# Patient Record
Sex: Male | Born: 1950 | Race: White | Hispanic: No | Marital: Married | Smoking: Never smoker
Health system: Southern US, Community
[De-identification: ages and names within clinical notes are randomized; demographics above are authoritative.]

## PROBLEM LIST (undated history)

## (undated) DIAGNOSIS — E039 Hypothyroidism, unspecified: Secondary | ICD-10-CM

## (undated) DIAGNOSIS — F329 Major depressive disorder, single episode, unspecified: Secondary | ICD-10-CM

## (undated) DIAGNOSIS — G4733 Obstructive sleep apnea (adult) (pediatric): Secondary | ICD-10-CM

## (undated) DIAGNOSIS — I619 Nontraumatic intracerebral hemorrhage, unspecified: Secondary | ICD-10-CM

## (undated) DIAGNOSIS — F419 Anxiety disorder, unspecified: Secondary | ICD-10-CM

## (undated) DIAGNOSIS — R519 Headache, unspecified: Secondary | ICD-10-CM

## (undated) DIAGNOSIS — R413 Other amnesia: Secondary | ICD-10-CM

## (undated) DIAGNOSIS — G629 Polyneuropathy, unspecified: Secondary | ICD-10-CM

## (undated) DIAGNOSIS — S069X9A Unspecified intracranial injury with loss of consciousness of unspecified duration, initial encounter: Secondary | ICD-10-CM

## (undated) DIAGNOSIS — H524 Presbyopia: Secondary | ICD-10-CM

## (undated) DIAGNOSIS — E785 Hyperlipidemia, unspecified: Secondary | ICD-10-CM

## (undated) DIAGNOSIS — R51 Headache: Secondary | ICD-10-CM

## (undated) DIAGNOSIS — E669 Obesity, unspecified: Secondary | ICD-10-CM

## (undated) DIAGNOSIS — B349 Viral infection, unspecified: Secondary | ICD-10-CM

## (undated) DIAGNOSIS — R531 Weakness: Secondary | ICD-10-CM

## (undated) DIAGNOSIS — I480 Paroxysmal atrial fibrillation: Secondary | ICD-10-CM

## (undated) DIAGNOSIS — F32A Depression, unspecified: Secondary | ICD-10-CM

## (undated) HISTORY — PX: KNEE SURGERY: SHX244

## (undated) HISTORY — PX: ROTATOR CUFF REPAIR: SHX139

## (undated) HISTORY — PX: APPENDECTOMY: SHX54

## (undated) HISTORY — PX: ACHILLES TENDON REPAIR: SUR1153

## (undated) HISTORY — DX: Weakness: R53.1

## (undated) HISTORY — DX: Presbyopia: H52.4

---

## 1999-01-09 ENCOUNTER — Emergency Department (HOSPITAL_COMMUNITY): Admission: EM | Admit: 1999-01-09 | Discharge: 1999-01-09 | Payer: Self-pay

## 2005-12-06 ENCOUNTER — Ambulatory Visit: Payer: Self-pay | Admitting: Gastroenterology

## 2005-12-20 ENCOUNTER — Ambulatory Visit: Payer: Self-pay | Admitting: Gastroenterology

## 2005-12-20 ENCOUNTER — Encounter (INDEPENDENT_AMBULATORY_CARE_PROVIDER_SITE_OTHER): Payer: Self-pay | Admitting: Specialist

## 2006-02-26 ENCOUNTER — Encounter: Admission: RE | Admit: 2006-02-26 | Discharge: 2006-02-26 | Payer: Self-pay | Admitting: Family Medicine

## 2006-05-02 ENCOUNTER — Encounter: Admission: RE | Admit: 2006-05-02 | Discharge: 2006-05-02 | Payer: Self-pay | Admitting: Orthopedic Surgery

## 2006-12-12 ENCOUNTER — Ambulatory Visit: Payer: Self-pay | Admitting: Family Medicine

## 2007-12-18 ENCOUNTER — Ambulatory Visit: Payer: Self-pay | Admitting: Family Medicine

## 2008-01-06 ENCOUNTER — Ambulatory Visit: Payer: Self-pay | Admitting: Family Medicine

## 2008-11-05 DIAGNOSIS — S069XAA Unspecified intracranial injury with loss of consciousness status unknown, initial encounter: Secondary | ICD-10-CM

## 2008-11-05 DIAGNOSIS — S069X9A Unspecified intracranial injury with loss of consciousness of unspecified duration, initial encounter: Secondary | ICD-10-CM

## 2008-11-05 HISTORY — DX: Unspecified intracranial injury with loss of consciousness status unknown, initial encounter: S06.9XAA

## 2008-11-05 HISTORY — DX: Unspecified intracranial injury with loss of consciousness of unspecified duration, initial encounter: S06.9X9A

## 2009-01-05 ENCOUNTER — Ambulatory Visit: Payer: Self-pay | Admitting: Family Medicine

## 2009-05-02 ENCOUNTER — Ambulatory Visit: Payer: Self-pay | Admitting: Family Medicine

## 2009-06-20 ENCOUNTER — Ambulatory Visit: Payer: Self-pay | Admitting: Family Medicine

## 2009-06-20 ENCOUNTER — Encounter: Admission: RE | Admit: 2009-06-20 | Discharge: 2009-06-20 | Payer: Self-pay | Admitting: Family Medicine

## 2009-07-01 ENCOUNTER — Encounter: Admission: RE | Admit: 2009-07-01 | Discharge: 2009-07-01 | Payer: Self-pay | Admitting: Neurological Surgery

## 2009-08-01 ENCOUNTER — Encounter: Admission: RE | Admit: 2009-08-01 | Discharge: 2009-08-01 | Payer: Self-pay | Admitting: Neurological Surgery

## 2010-11-27 ENCOUNTER — Encounter: Payer: Self-pay | Admitting: Family Medicine

## 2013-01-06 ENCOUNTER — Ambulatory Visit: Payer: Non-veteran care | Attending: Physician Assistant | Admitting: Physical Therapy

## 2013-01-06 DIAGNOSIS — IMO0001 Reserved for inherently not codable concepts without codable children: Secondary | ICD-10-CM | POA: Insufficient documentation

## 2013-01-06 DIAGNOSIS — M25669 Stiffness of unspecified knee, not elsewhere classified: Secondary | ICD-10-CM | POA: Insufficient documentation

## 2013-01-06 DIAGNOSIS — R262 Difficulty in walking, not elsewhere classified: Secondary | ICD-10-CM | POA: Insufficient documentation

## 2013-01-06 DIAGNOSIS — M25569 Pain in unspecified knee: Secondary | ICD-10-CM | POA: Insufficient documentation

## 2013-01-12 ENCOUNTER — Ambulatory Visit: Payer: Non-veteran care | Admitting: Physical Therapy

## 2013-01-14 ENCOUNTER — Ambulatory Visit: Payer: Non-veteran care | Admitting: Physical Therapy

## 2013-01-16 ENCOUNTER — Ambulatory Visit: Payer: Non-veteran care | Admitting: Physical Therapy

## 2013-01-26 ENCOUNTER — Ambulatory Visit: Payer: Non-veteran care | Admitting: Physical Therapy

## 2013-01-28 ENCOUNTER — Ambulatory Visit: Payer: Non-veteran care | Admitting: Physical Therapy

## 2013-01-30 ENCOUNTER — Ambulatory Visit: Payer: Non-veteran care | Admitting: Physical Therapy

## 2013-02-02 ENCOUNTER — Ambulatory Visit: Payer: Non-veteran care | Admitting: Physical Therapy

## 2013-02-04 ENCOUNTER — Ambulatory Visit: Payer: Non-veteran care | Attending: Physician Assistant | Admitting: Physical Therapy

## 2013-02-04 DIAGNOSIS — R262 Difficulty in walking, not elsewhere classified: Secondary | ICD-10-CM | POA: Insufficient documentation

## 2013-02-04 DIAGNOSIS — M25569 Pain in unspecified knee: Secondary | ICD-10-CM | POA: Insufficient documentation

## 2013-02-04 DIAGNOSIS — M25669 Stiffness of unspecified knee, not elsewhere classified: Secondary | ICD-10-CM | POA: Insufficient documentation

## 2013-02-04 DIAGNOSIS — IMO0001 Reserved for inherently not codable concepts without codable children: Secondary | ICD-10-CM | POA: Insufficient documentation

## 2013-02-06 ENCOUNTER — Ambulatory Visit: Payer: Non-veteran care | Admitting: Physical Therapy

## 2013-02-09 ENCOUNTER — Ambulatory Visit: Payer: Non-veteran care | Admitting: Physical Therapy

## 2013-02-11 ENCOUNTER — Ambulatory Visit: Payer: Non-veteran care | Admitting: Physical Therapy

## 2013-02-13 ENCOUNTER — Ambulatory Visit: Payer: Non-veteran care | Admitting: Rehabilitation

## 2013-02-16 ENCOUNTER — Ambulatory Visit: Payer: Non-veteran care | Admitting: Physical Therapy

## 2013-02-18 ENCOUNTER — Ambulatory Visit: Payer: Non-veteran care | Admitting: Physical Therapy

## 2013-02-24 ENCOUNTER — Ambulatory Visit: Payer: Non-veteran care | Admitting: Rehabilitation

## 2013-02-24 ENCOUNTER — Ambulatory Visit: Payer: Non-veteran care | Admitting: Physical Therapy

## 2013-02-26 ENCOUNTER — Ambulatory Visit: Payer: Non-veteran care | Admitting: Physical Therapy

## 2015-12-07 ENCOUNTER — Encounter: Payer: Self-pay | Admitting: Gastroenterology

## 2016-10-08 DIAGNOSIS — E039 Hypothyroidism, unspecified: Secondary | ICD-10-CM | POA: Diagnosis present

## 2016-10-08 DIAGNOSIS — E079 Disorder of thyroid, unspecified: Secondary | ICD-10-CM | POA: Diagnosis present

## 2016-10-08 DIAGNOSIS — F32A Depression, unspecified: Secondary | ICD-10-CM | POA: Diagnosis present

## 2016-10-08 DIAGNOSIS — F419 Anxiety disorder, unspecified: Secondary | ICD-10-CM | POA: Diagnosis present

## 2016-10-08 DIAGNOSIS — F329 Major depressive disorder, single episode, unspecified: Secondary | ICD-10-CM | POA: Diagnosis present

## 2016-10-15 ENCOUNTER — Inpatient Hospital Stay (HOSPITAL_COMMUNITY)
Admission: EM | Admit: 2016-10-15 | Discharge: 2016-10-21 | DRG: 193 | Disposition: A | Discharge status: Home / Self Care | Payer: BC Managed Care – PPO | Attending: Internal Medicine | Admitting: Internal Medicine

## 2016-10-15 ENCOUNTER — Emergency Department (HOSPITAL_COMMUNITY): Payer: BC Managed Care – PPO

## 2016-10-15 ENCOUNTER — Encounter (HOSPITAL_COMMUNITY): Payer: Self-pay | Admitting: Emergency Medicine

## 2016-10-15 ENCOUNTER — Inpatient Hospital Stay (HOSPITAL_COMMUNITY): Payer: BC Managed Care – PPO

## 2016-10-15 DIAGNOSIS — Z841 Family history of disorders of kidney and ureter: Secondary | ICD-10-CM | POA: Diagnosis not present

## 2016-10-15 DIAGNOSIS — F329 Major depressive disorder, single episode, unspecified: Secondary | ICD-10-CM | POA: Diagnosis present

## 2016-10-15 DIAGNOSIS — F323 Major depressive disorder, single episode, severe with psychotic features: Secondary | ICD-10-CM | POA: Diagnosis not present

## 2016-10-15 DIAGNOSIS — I514 Myocarditis, unspecified: Secondary | ICD-10-CM | POA: Diagnosis not present

## 2016-10-15 DIAGNOSIS — I480 Paroxysmal atrial fibrillation: Secondary | ICD-10-CM | POA: Diagnosis present

## 2016-10-15 DIAGNOSIS — G43909 Migraine, unspecified, not intractable, without status migrainosus: Secondary | ICD-10-CM

## 2016-10-15 DIAGNOSIS — I10 Essential (primary) hypertension: Secondary | ICD-10-CM | POA: Diagnosis present

## 2016-10-15 DIAGNOSIS — Z9119 Patient's noncompliance with other medical treatment and regimen: Secondary | ICD-10-CM

## 2016-10-15 DIAGNOSIS — R945 Abnormal results of liver function studies: Secondary | ICD-10-CM | POA: Diagnosis present

## 2016-10-15 DIAGNOSIS — R0602 Shortness of breath: Secondary | ICD-10-CM | POA: Diagnosis present

## 2016-10-15 DIAGNOSIS — R7989 Other specified abnormal findings of blood chemistry: Secondary | ICD-10-CM

## 2016-10-15 DIAGNOSIS — G4733 Obstructive sleep apnea (adult) (pediatric): Secondary | ICD-10-CM

## 2016-10-15 DIAGNOSIS — E785 Hyperlipidemia, unspecified: Secondary | ICD-10-CM | POA: Diagnosis present

## 2016-10-15 DIAGNOSIS — E8770 Fluid overload, unspecified: Secondary | ICD-10-CM | POA: Diagnosis present

## 2016-10-15 DIAGNOSIS — Z888 Allergy status to other drugs, medicaments and biological substances status: Secondary | ICD-10-CM | POA: Diagnosis not present

## 2016-10-15 DIAGNOSIS — Z8042 Family history of malignant neoplasm of prostate: Secondary | ICD-10-CM

## 2016-10-15 DIAGNOSIS — G43011 Migraine without aura, intractable, with status migrainosus: Secondary | ICD-10-CM

## 2016-10-15 DIAGNOSIS — F419 Anxiety disorder, unspecified: Secondary | ICD-10-CM | POA: Diagnosis present

## 2016-10-15 DIAGNOSIS — R7303 Prediabetes: Secondary | ICD-10-CM

## 2016-10-15 DIAGNOSIS — I959 Hypotension, unspecified: Secondary | ICD-10-CM | POA: Diagnosis present

## 2016-10-15 DIAGNOSIS — E669 Obesity, unspecified: Secondary | ICD-10-CM

## 2016-10-15 DIAGNOSIS — F32A Depression, unspecified: Secondary | ICD-10-CM | POA: Diagnosis present

## 2016-10-15 DIAGNOSIS — Z79899 Other long term (current) drug therapy: Secondary | ICD-10-CM

## 2016-10-15 DIAGNOSIS — R000 Tachycardia, unspecified: Secondary | ICD-10-CM | POA: Diagnosis present

## 2016-10-15 DIAGNOSIS — J9601 Acute respiratory failure with hypoxia: Secondary | ICD-10-CM

## 2016-10-15 DIAGNOSIS — Z8249 Family history of ischemic heart disease and other diseases of the circulatory system: Secondary | ICD-10-CM

## 2016-10-15 DIAGNOSIS — R748 Abnormal levels of other serum enzymes: Secondary | ICD-10-CM

## 2016-10-15 DIAGNOSIS — Z6841 Body Mass Index (BMI) 40.0 and over, adult: Secondary | ICD-10-CM

## 2016-10-15 DIAGNOSIS — E876 Hypokalemia: Secondary | ICD-10-CM | POA: Diagnosis present

## 2016-10-15 DIAGNOSIS — G629 Polyneuropathy, unspecified: Secondary | ICD-10-CM

## 2016-10-15 DIAGNOSIS — I409 Acute myocarditis, unspecified: Secondary | ICD-10-CM

## 2016-10-15 DIAGNOSIS — J181 Lobar pneumonia, unspecified organism: Secondary | ICD-10-CM | POA: Diagnosis not present

## 2016-10-15 DIAGNOSIS — E079 Disorder of thyroid, unspecified: Secondary | ICD-10-CM | POA: Diagnosis present

## 2016-10-15 DIAGNOSIS — I9589 Other hypotension: Secondary | ICD-10-CM | POA: Diagnosis not present

## 2016-10-15 DIAGNOSIS — E6609 Other obesity due to excess calories: Secondary | ICD-10-CM | POA: Diagnosis not present

## 2016-10-15 DIAGNOSIS — J189 Pneumonia, unspecified organism: Secondary | ICD-10-CM | POA: Diagnosis present

## 2016-10-15 DIAGNOSIS — E039 Hypothyroidism, unspecified: Secondary | ICD-10-CM | POA: Diagnosis present

## 2016-10-15 DIAGNOSIS — F321 Major depressive disorder, single episode, moderate: Secondary | ICD-10-CM | POA: Diagnosis not present

## 2016-10-15 HISTORY — DX: Viral infection, unspecified: B34.9

## 2016-10-15 HISTORY — DX: Other amnesia: R41.3

## 2016-10-15 HISTORY — DX: Polyneuropathy, unspecified: G62.9

## 2016-10-15 HISTORY — DX: Unspecified intracranial injury with loss of consciousness of unspecified duration, initial encounter: S06.9X9A

## 2016-10-15 HISTORY — DX: Paroxysmal atrial fibrillation: I48.0

## 2016-10-15 HISTORY — DX: Headache: R51

## 2016-10-15 HISTORY — DX: Hyperlipidemia, unspecified: E78.5

## 2016-10-15 HISTORY — DX: Headache, unspecified: R51.9

## 2016-10-15 HISTORY — DX: Hypothyroidism, unspecified: E03.9

## 2016-10-15 HISTORY — DX: Obstructive sleep apnea (adult) (pediatric): G47.33

## 2016-10-15 HISTORY — DX: Major depressive disorder, single episode, unspecified: F32.9

## 2016-10-15 HISTORY — DX: Nontraumatic intracerebral hemorrhage, unspecified: I61.9

## 2016-10-15 HISTORY — DX: Obesity, unspecified: E66.9

## 2016-10-15 HISTORY — DX: Depression, unspecified: F32.A

## 2016-10-15 HISTORY — DX: Anxiety disorder, unspecified: F41.9

## 2016-10-15 LAB — URINALYSIS, ROUTINE W REFLEX MICROSCOPIC
BILIRUBIN URINE: NEGATIVE
GLUCOSE, UA: NEGATIVE mg/dL
HGB URINE DIPSTICK: NEGATIVE
KETONES UR: NEGATIVE mg/dL
Leukocytes, UA: NEGATIVE
Nitrite: NEGATIVE
PROTEIN: NEGATIVE mg/dL
Specific Gravity, Urine: 1.011 (ref 1.005–1.030)
pH: 5 (ref 5.0–8.0)

## 2016-10-15 LAB — TROPONIN I
Troponin I: <0.03 ng/mL (ref <0.03)
Troponin I: <0.03 ng/mL (ref <0.03)
Troponin I: 0.09 ng/mL (ref <0.03)

## 2016-10-15 LAB — COMPREHENSIVE METABOLIC PANEL
ALK PHOS: 338 U/L — AB (ref 38–126)
ALT: 63 U/L (ref 17–63)
ANION GAP: 12 (ref 5–15)
AST: 56 U/L — ABNORMAL HIGH (ref 15–41)
Albumin: 2.5 g/dL — ABNORMAL LOW (ref 3.5–5.0)
BUN: 12 mg/dL (ref 6–20)
CALCIUM: 8.9 mg/dL (ref 8.9–10.3)
CHLORIDE: 101 mmol/L (ref 101–111)
CO2: 24 mmol/L (ref 22–32)
CREATININE: 1.08 mg/dL (ref 0.61–1.24)
Glucose, Bld: 101 mg/dL — ABNORMAL HIGH (ref 65–99)
Potassium: 2.7 mmol/L — CL (ref 3.5–5.1)
SODIUM: 137 mmol/L (ref 135–145)
Total Bilirubin: 1.9 mg/dL — ABNORMAL HIGH (ref 0.3–1.2)
Total Protein: 5.6 g/dL — ABNORMAL LOW (ref 6.5–8.1)

## 2016-10-15 LAB — CBC WITH DIFFERENTIAL/PLATELET
BASOS ABS: 0 10*3/uL (ref 0.0–0.1)
BASOS PCT: 0 %
EOS PCT: 2 %
Eosinophils Absolute: 0.2 10*3/uL (ref 0.0–0.7)
HEMATOCRIT: 36.7 % — AB (ref 39.0–52.0)
Hemoglobin: 12.3 g/dL — ABNORMAL LOW (ref 13.0–17.0)
Lymphocytes Relative: 2 %
Lymphs Abs: 0.2 10*3/uL — ABNORMAL LOW (ref 0.7–4.0)
MCH: 30.1 pg (ref 26.0–34.0)
MCHC: 33.5 g/dL (ref 30.0–36.0)
MCV: 89.7 fL (ref 78.0–100.0)
MONOS PCT: 2 %
Monocytes Absolute: 0.2 10*3/uL (ref 0.1–1.0)
NEUTROS ABS: 9.3 10*3/uL — AB (ref 1.7–7.7)
Neutrophils Relative %: 94 %
PLATELETS: 242 10*3/uL (ref 150–400)
RBC: 4.09 MIL/uL — ABNORMAL LOW (ref 4.22–5.81)
RDW: 13.7 % (ref 11.5–15.5)
WBC: 9.9 10*3/uL (ref 4.0–10.5)

## 2016-10-15 LAB — ECHOCARDIOGRAM COMPLETE

## 2016-10-15 LAB — MRSA PCR SCREENING: MRSA by PCR: NEGATIVE

## 2016-10-15 LAB — PROCALCITONIN: PROCALCITONIN: 9.19 ng/mL

## 2016-10-15 LAB — BILIRUBIN, FRACTIONATED(TOT/DIR/INDIR)
BILIRUBIN INDIRECT: 0.7 mg/dL (ref 0.3–0.9)
Bilirubin, Direct: 0.7 mg/dL — ABNORMAL HIGH (ref 0.1–0.5)
Total Bilirubin: 1.4 mg/dL — ABNORMAL HIGH (ref 0.3–1.2)

## 2016-10-15 LAB — D-DIMER, QUANTITATIVE (NOT AT ARMC): D DIMER QUANT: 8.94 ug{FEU}/mL — AB (ref 0.00–0.50)

## 2016-10-15 LAB — PREALBUMIN: PREALBUMIN: 6.1 mg/dL — AB (ref 18–38)

## 2016-10-15 LAB — TSH: TSH: 3.072 u[IU]/mL (ref 0.350–4.500)

## 2016-10-15 LAB — BRAIN NATRIURETIC PEPTIDE: B NATRIURETIC PEPTIDE 5: 478.8 pg/mL — AB (ref 0.0–100.0)

## 2016-10-15 LAB — I-STAT CG4 LACTIC ACID, ED: LACTIC ACID, VENOUS: 2.38 mmol/L — AB (ref 0.5–1.9)

## 2016-10-15 LAB — LACTIC ACID, PLASMA: LACTIC ACID, VENOUS: 2.3 mmol/L — AB (ref 0.5–1.9)

## 2016-10-15 MED ORDER — MAGNESIUM CITRATE PO SOLN: 1.0000 | Freq: Once | ORAL | Status: DC | PRN

## 2016-10-15 MED ORDER — VANCOMYCIN HCL IN DEXTROSE 1-5 GM/200ML-% IV SOLN
1000.0000 mg | Freq: Once | INTRAVENOUS | Status: AC
  Administered 2016-10-15: 1000 mg via INTRAVENOUS
  Filled 2016-10-15: qty 200

## 2016-10-15 MED ORDER — HYDROCODONE-ACETAMINOPHEN 5-325 MG PO TABS
1.0000 | ORAL_TABLET | ORAL | Status: DC | PRN
  Administered 2016-10-20: 1 via ORAL
  Filled 2016-10-15: qty 1

## 2016-10-15 MED ORDER — ONDANSETRON HCL 4 MG/2ML IJ SOLN
4.0000 mg | Freq: Four times a day (QID) | INTRAMUSCULAR | Status: DC | PRN
Start: 2016-10-15 — End: 2016-10-21

## 2016-10-15 MED ORDER — SODIUM CHLORIDE 0.9% FLUSH
3.0000 mL | Freq: Two times a day (BID) | INTRAVENOUS | Status: DC
  Administered 2016-10-15 – 2016-10-21 (×12): 3 mL via INTRAVENOUS

## 2016-10-15 MED ORDER — DICLOFENAC SODIUM 75 MG PO TBEC: 75.0000 mg | DELAYED_RELEASE_TABLET | Freq: Every day | ORAL | Status: DC | PRN

## 2016-10-15 MED ORDER — ENOXAPARIN SODIUM 40 MG/0.4ML ~~LOC~~ SOLN
40.0000 mg | SUBCUTANEOUS | Status: DC
  Administered 2016-10-15 – 2016-10-21 (×7): 40 mg via SUBCUTANEOUS
  Filled 2016-10-15 (×7): qty 0.4

## 2016-10-15 MED ORDER — SIMVASTATIN 40 MG PO TABS
40.0000 mg | ORAL_TABLET | Freq: Every day | ORAL | Status: DC
  Administered 2016-10-15 – 2016-10-21 (×7): 40 mg via ORAL
  Filled 2016-10-15 (×7): qty 1

## 2016-10-15 MED ORDER — ACETAMINOPHEN 325 MG PO TABS
650.0000 mg | ORAL_TABLET | Freq: Four times a day (QID) | ORAL | Status: DC | PRN
  Administered 2016-10-19 – 2016-10-21 (×4): 650 mg via ORAL
  Filled 2016-10-15 (×4): qty 2

## 2016-10-15 MED ORDER — FUROSEMIDE 10 MG/ML IJ SOLN
20.0000 mg | Freq: Once | INTRAMUSCULAR | Status: AC
  Administered 2016-10-15: 20 mg via INTRAVENOUS
  Filled 2016-10-15: qty 2

## 2016-10-15 MED ORDER — GABAPENTIN 300 MG PO CAPS: 300.0000 mg | ORAL_CAPSULE | ORAL | Status: DC

## 2016-10-15 MED ORDER — DEXTROSE 5 % IV SOLN
1.0000 g | Freq: Three times a day (TID) | INTRAVENOUS | Status: DC
  Administered 2016-10-15 – 2016-10-16 (×4): 1 g via INTRAVENOUS
  Filled 2016-10-15 (×6): qty 1

## 2016-10-15 MED ORDER — HYDROCORTISONE NA SUCCINATE PF 100 MG IJ SOLR
100.0000 mg | Freq: Four times a day (QID) | INTRAMUSCULAR | Status: DC
  Administered 2016-10-15 – 2016-10-17 (×10): 100 mg via INTRAVENOUS
  Filled 2016-10-15 (×9): qty 2

## 2016-10-15 MED ORDER — SUMATRIPTAN SUCCINATE 100 MG PO TABS: 100.0000 mg | ORAL_TABLET | Freq: Once | ORAL | Status: DC | PRN

## 2016-10-15 MED ORDER — SODIUM CHLORIDE 0.9 % IV BOLUS (SEPSIS)
1000.0000 mL | Freq: Once | INTRAVENOUS | Status: AC
  Administered 2016-10-15: 1000 mL via INTRAVENOUS

## 2016-10-15 MED ORDER — POTASSIUM CHLORIDE CRYS ER 20 MEQ PO TBCR
40.0000 meq | EXTENDED_RELEASE_TABLET | Freq: Once | ORAL | Status: AC
  Administered 2016-10-15: 40 meq via ORAL
  Filled 2016-10-15: qty 2

## 2016-10-15 MED ORDER — ONDANSETRON HCL 4 MG PO TABS
4.0000 mg | ORAL_TABLET | Freq: Four times a day (QID) | ORAL | Status: DC | PRN
  Administered 2016-10-18 – 2016-10-20 (×2): 4 mg via ORAL
  Filled 2016-10-15 (×2): qty 1

## 2016-10-15 MED ORDER — BISACODYL 10 MG RE SUPP: 10.0000 mg | Freq: Every day | RECTAL | Status: DC | PRN

## 2016-10-15 MED ORDER — WHITE PETROLATUM GEL
Status: AC
  Administered 2016-10-15: 19:00:00
  Filled 2016-10-15: qty 1

## 2016-10-15 MED ORDER — GABAPENTIN 300 MG PO CAPS
900.0000 mg | ORAL_CAPSULE | Freq: Every day | ORAL | Status: DC
  Administered 2016-10-15 – 2016-10-20 (×6): 900 mg via ORAL
  Filled 2016-10-15 (×6): qty 3

## 2016-10-15 MED ORDER — TEMAZEPAM 15 MG PO CAPS
30.0000 mg | ORAL_CAPSULE | Freq: Every evening | ORAL | Status: DC | PRN
  Administered 2016-10-15 – 2016-10-20 (×6): 30 mg via ORAL
  Filled 2016-10-15 (×6): qty 2

## 2016-10-15 MED ORDER — SODIUM CHLORIDE 0.9 % IV SOLN
INTRAVENOUS | Status: DC
  Administered 2016-10-16: via INTRAVENOUS

## 2016-10-15 MED ORDER — SENNOSIDES-DOCUSATE SODIUM 8.6-50 MG PO TABS: 1.0000 | ORAL_TABLET | Freq: Every evening | ORAL | Status: DC | PRN

## 2016-10-15 MED ORDER — IOPAMIDOL (ISOVUE-370) INJECTION 76%
INTRAVENOUS | Status: AC
  Administered 2016-10-15: 100 mL
  Filled 2016-10-15: qty 100

## 2016-10-15 MED ORDER — VANCOMYCIN HCL IN DEXTROSE 1-5 GM/200ML-% IV SOLN
1000.0000 mg | Freq: Two times a day (BID) | INTRAVENOUS | Status: DC
  Administered 2016-10-15 – 2016-10-16 (×2): 1000 mg via INTRAVENOUS
  Filled 2016-10-15 (×4): qty 200

## 2016-10-15 MED ORDER — DONEPEZIL HCL 10 MG PO TABS
10.0000 mg | ORAL_TABLET | Freq: Every day | ORAL | Status: DC
  Administered 2016-10-15 – 2016-10-20 (×6): 10 mg via ORAL
  Filled 2016-10-15 (×8): qty 1

## 2016-10-15 MED ORDER — LEVOTHYROXINE SODIUM 100 MCG PO TABS
100.0000 ug | ORAL_TABLET | Freq: Every day | ORAL | Status: DC
  Administered 2016-10-15 – 2016-10-21 (×7): 100 ug via ORAL
  Filled 2016-10-15 (×7): qty 1

## 2016-10-15 MED ORDER — VENLAFAXINE HCL ER 75 MG PO CP24
75.0000 mg | ORAL_CAPSULE | Freq: Every day | ORAL | Status: DC
  Administered 2016-10-15 – 2016-10-21 (×7): 75 mg via ORAL
  Filled 2016-10-15 (×8): qty 1

## 2016-10-15 MED ORDER — ACETAMINOPHEN 650 MG RE SUPP: 650.0000 mg | Freq: Four times a day (QID) | RECTAL | Status: DC | PRN

## 2016-10-15 MED ORDER — SODIUM CHLORIDE 0.9 % IV SOLN
30.0000 meq | Freq: Once | INTRAVENOUS | Status: AC
  Administered 2016-10-15: 30 meq via INTRAVENOUS
  Filled 2016-10-15: qty 15

## 2016-10-15 MED ORDER — PROPRANOLOL HCL ER 160 MG PO CP24
160.0000 mg | ORAL_CAPSULE | Freq: Every day | ORAL | Status: DC
  Administered 2016-10-15 – 2016-10-21 (×7): 160 mg via ORAL
  Filled 2016-10-15 (×8): qty 1

## 2016-10-15 MED ORDER — INFLUENZA VAC SPLIT QUAD 0.5 ML IM SUSY: 0.5000 mL | PREFILLED_SYRINGE | INTRAMUSCULAR | Status: DC

## 2016-10-15 MED ORDER — DEXTROSE 5 % IV SOLN
2.0000 g | Freq: Once | INTRAVENOUS | Status: AC
  Administered 2016-10-15: 2 g via INTRAVENOUS
  Filled 2016-10-15: qty 2

## 2016-10-15 MED ORDER — HYDROCORTISONE NA SUCCINATE PF 100 MG IJ SOLR
100.0000 mg | Freq: Once | INTRAMUSCULAR | Status: AC
  Administered 2016-10-15: 100 mg via INTRAVENOUS
  Filled 2016-10-15: qty 2

## 2016-10-15 MED ORDER — GABAPENTIN 300 MG PO CAPS
300.0000 mg | ORAL_CAPSULE | Freq: Every day | ORAL | Status: DC
  Administered 2016-10-15 – 2016-10-21 (×7): 300 mg via ORAL
  Filled 2016-10-15 (×7): qty 1

## 2016-10-15 NOTE — Progress Notes (Signed)
CRITICAL VALUE ALERT  Critical value received:  Troponin 0.09 Date of notification:  10/15/16 Time of notification:  0920 Critical value read back:yes Nurse who received alert: Marshia Lylaire O'Keeffe MD notified (1st page):  Craige CottaKirby Time of first page:  0922 Responding MD:  Craige CottaKirby Time MD responded:  [phone]

## 2016-10-15 NOTE — ED Triage Notes (Signed)
Patient reports SOB onset this evening with nausea , fatigue and generalized weakness , denies fever or chills .

## 2016-10-15 NOTE — ED Notes (Signed)
Dr. Preston FleetingGlick notified on pt.'s pottasium result.

## 2016-10-15 NOTE — ED Provider Notes (Signed)
[facility]-EMERGENCY DEPT Provider Note   CSN: [id number] Arrival date & time: 10/15/16  4098     History   Chief Complaint Chief Complaint  Patient presents with  . Shortness of Breath    HPI Rickey Rivera is a 65 y.o. male.  He was discharged from [facility] in [place] yesterday after being admitted for a febrile illness. He states that test for flu and multiple other tests were negative. He noted severe dyspnea today. He has had a nonproductive cough. He denies fever since discharge from the hospital although he did have a fever when he was admitted. He has had chills and sweats. He denies nausea or vomiting. He is complaining of generalized myalgias. EMS reported he was hypoxic and placed him on oxygen. They gave a nebulizer treatment en route. They did note blood pressure dropped to the 80s just prior to arriving in the ED.   The history is provided by the patient.    Past Medical History:  Diagnosis Date  . Hypertension   . Obesity   . Thyroid disease     There are no active problems to display for this patient.   History reviewed. No pertinent surgical history.     Home Medications    Prior to Admission medications   Not on File    Family History No family history on file.  Social History Social History  Substance Use Topics  . Smoking status: Never Smoker  . Smokeless tobacco: Never Used  . Alcohol use No     Allergies   Patient has no known allergies.   Review of Systems Review of Systems  All other systems reviewed and are negative.    Physical Exam Updated Vital Signs BP 99/69 (BP Location: Left Arm)   Pulse 112   Temp 98.7 F (37.1 C) (Oral)   Resp 22   Physical Exam  Nursing note and vitals reviewed.  65 year old male, resting comfortably and in no acute distress. Vital signs are Significant for tachycardia and tachypnea. Oxygen saturation is 95%, which is normal. Head is normocephalic and atraumatic. PERRLA, EOMI.  Oropharynx is clear. Neck is nontender and supple without adenopathy or JVD. Back is nontender and there is no CVA tenderness. Lungs are clear without rales, wheezes, or rhonchi. Chest is nontender. Heart has regular rate and rhythm without murmur. Abdomen is soft, flat, nontender without masses or hepatosplenomegaly and peristalsis is normoactive. Extremities have trace edema, full range of motion is present. Skin is warm and dry without rash. Neurologic: Mental status is normal, cranial nerves are intact, there are no motor or sensory deficits.  ED Treatments / Results  Labs (all labs ordered are listed, but only abnormal results are displayed) Labs Reviewed  D-DIMER, QUANTITATIVE (NOT AT [facility]) - Abnormal; Notable for the following:       Result Value   D-Dimer, Quant 8.94 (*)    All other components within normal limits  CBC WITH DIFFERENTIAL/PLATELET - Abnormal; Notable for the following:    RBC 4.09 (*)    Hemoglobin 12.3 (*)    HCT 36.7 (*)    Neutro Abs 9.3 (*)    Lymphs Abs 0.2 (*)    All other components within normal limits  BRAIN NATRIURETIC PEPTIDE - Abnormal; Notable for the following:    B Natriuretic Peptide 478.8 (*)    All other components within normal limits  COMPREHENSIVE METABOLIC PANEL - Abnormal; Notable for the following:    Potassium 2.7 (*)  Glucose, Bld 101 (*)    Total Protein 5.6 (*)    Albumin 2.5 (*)    AST 56 (*)    Alkaline Phosphatase 338 (*)    Total Bilirubin 1.9 (*)    All other components within normal limits  LACTIC ACID, PLASMA - Abnormal; Notable for the following:    Lactic Acid, Venous 2.3 (*)    All other components within normal limits  I-STAT CG4 LACTIC ACID, ED - Abnormal; Notable for the following:    Lactic Acid, Venous 2.38 (*)    All other components within normal limits  CULTURE, BLOOD (ROUTINE X 2)  CULTURE, BLOOD (ROUTINE X 2)  URINE CULTURE  TROPONIN I  URINALYSIS, ROUTINE W REFLEX MICROSCOPIC    EKG  EKG  Interpretation  Date/Time:  Monday October 15 2016 00:44:23 EST Ventricular Rate:  115 PR Interval:    QRS Duration: 93 QT Interval:  326 QTC Calculation: 451 R Axis:   -44 Text Interpretation:  Sinus tachycardia Probable left atrial enlargement Left axis deviation Low voltage, extremity and precordial leads Probable anteroseptal infarct, old No old tracing to compare Confirmed by [facility]  MD, Carlos Heber ([id number]) on 10/15/2016 1:01:40 AM       Radiology Ct Angio Chest Pe W And/or Wo Contrast  Result Date: 10/15/2016 CLINICAL DATA:  65 year old male with shortness of breath and hypertension. EXAM: CT ANGIOGRAPHY CHEST WITH CONTRAST TECHNIQUE: Multidetector CT imaging of the chest was performed using the standard protocol during bolus administration of intravenous contrast. Multiplanar CT image reconstructions and MIPs were obtained to evaluate the vascular anatomy. CONTRAST:  80 cc Isovue 370 COMPARISON:  Chest radiograph dated 10/15/2016 FINDINGS: Cardiovascular: Evaluation of the pulmonary artery is is somewhat limited due to respiratory motion artifact and suboptimal visualization of the peripheral branches. No definite pulmonary embolus identified. There is no cardiomegaly or pericardial effusion. There is retrograde flow of contrast from the right atrium into the IVC concerning for right cardiac dysfunction. Correlation with echocardiogram recommended. The thoracic aorta appears unremarkable. The origins of the great vessels of the aortic arch appear patent. Mediastinum/Nodes: There is right hilar adenopathy. The esophagus is grossly unremarkable. The thyroid gland is not visualized. Lungs/Pleura: There is diffuse interstitial and interlobular septal prominence with Kerley B-lines compatible with interstitial edema. Patchy area of she ground-glass and hazy density in the right upper lobe may represent atelectatic changes or infiltrate. Mild thickening of the right lower lobe bronchi. Bibasilar  subsegmental consolidative changes likely atelectasis. Infiltrate is not excluded. Small bilateral pleural effusions noted. There is no pneumothorax. Upper Abdomen: No acute abnormality. Musculoskeletal: There is degenerative changes of the spine with osteophyte. No acute fracture. Review of the MIP images confirms the above findings. IMPRESSION: No definite CT evidence of pulmonary embolus. Interstitial edema with small bilateral pleural effusions. Areas of hazy and ground-glass density in the right upper lobe may be atelectatic changes or superimposed infiltrate. Bibasilar subsegmental atelectasis/less likely infiltrate. Right hilar adenopathy. Findings suggestive of a degree of right cardiac dysfunction. Correlation with echocardiogram recommended. Electronically Signed   By: Elgie Collard M.D.   On: 10/15/2016 04:10   Dg Chest Port 1 View  Result Date: 10/15/2016 CLINICAL DATA:  Dyspnea without fever chills. EXAM: PORTABLE CHEST 1 VIEW COMPARISON:  01/09/1999 CXR at the report FINDINGS: The lung volumes are low. The heart is top-normal in size. No aortic aneurysm. Mild pulmonary vascular congestion is seen. No suspicious osseous abnormality. IMPRESSION: Mild vascular congestion. Low volumes without pneumonic consolidation. Borderline cardiomegaly.  Electronically Signed   By: Tollie Ethavid  Kwon M.D.   On: 10/15/2016 02:00    Procedures Procedures (including critical care time) CRITICAL CARE Performed by: MWNUU,VOZDGGLICK,Helem Reesor Total critical care time: 85 minutes Critical care time was exclusive of separately billable procedures and treating other patients. Critical care was necessary to treat or prevent imminent or life-threatening deterioration. Critical care was time spent personally by me on the following activities: development of treatment plan with patient and/or surrogate as well as nursing, discussions with consultants, evaluation of patient's response to treatment, examination of patient, obtaining  history from patient or surrogate, ordering and performing treatments and interventions, ordering and review of laboratory studies, ordering and review of radiographic studies, pulse oximetry and re-evaluation of patient's condition.  Medications Ordered in ED Medications  vancomycin (VANCOCIN) IVPB 1000 mg/200 mL premix (not administered)  ceFEPIme (MAXIPIME) 2 g in dextrose 5 % 50 mL IVPB (not administered)  sodium chloride 0.9 % bolus 1,000 mL (0 mLs Intravenous Stopped 10/15/16 0213)  sodium chloride 0.9 % bolus 1,000 mL (0 mLs Intravenous Stopped 10/15/16 0213)  potassium chloride 30 mEq in sodium chloride 0.9 % 265 mL (KCL MULTIRUN) IVPB (30 mEq Intravenous Given 10/15/16 0221)  potassium chloride SA (K-DUR,KLOR-CON) CR tablet 40 mEq (40 mEq Oral Given 10/15/16 0204)  iopamidol (ISOVUE-370) 76 % injection (100 mLs  Contrast Given 10/15/16 0300)  sodium chloride 0.9 % bolus 1,000 mL (0 mLs Intravenous Stopped 10/15/16 0525)  hydrocortisone sodium succinate (SOLU-CORTEF) 100 MG injection 100 mg (100 mg Intravenous Given 10/15/16 0444)     Initial Impression / Assessment and Plan / ED Course  I have reviewed the triage vital signs and the nursing notes.  Pertinent labs & imaging results that were available during my care of the patient were reviewed by me and considered in my medical decision making (see chart for details).  Clinical Course    Dyspnea following hospital admission. Patient does not recall getting any anticoagulant injections so he might be at risk for pulmonary embolism. Screening labs are obtained including d-dimer and chest x-ray. Old records are reviewed and his records from [place] are in the computer and he was diagnosed with a viral illness. All workup including lumbar puncture were negative.  Lactic acid is come back slightly elevated and is started on early goal-directed fluid treatment.  D-dimer is come back elevated and he is sent for CT angiogram which  suggests interstitial edema but no evidence of pneumonia. At this point, elevated lactic acid is felt to be most likely due to general hypertension and not sepsis. He does not have fever or any other signs of sepsis. Blood pressure started to drift down again and he was given additional fluid. He does state that his mother had Addison's disease. He is given a dose of Solu-Cortef. He is noted to be significantly hypokalemic and is given intravenous and oral potassium. BNP is come back significantly elevated. Metabolic panel shows elevated transaminases and bilirubin. Overall, picture seems most consistent with viral myocarditis. He will need to be admitted for blood pressure support. Case is discussed with cardiology fellow who states he is willing to see the patient in consult after evaluation by hospitalist. Case is discussed with Dr. Maryfrances Bunnellanford of [facility] who agrees to admit the patient.  Final Clinical Impressions(s) / ED Diagnoses   Final diagnoses:  Hypotension, unspecified hypotension type  Shortness of breath  Elevated brain natriuretic peptide (BNP) level  Elevated liver enzymes  Hypokalemia    New Prescriptions  New Prescriptions   No medications on file     Dione Boozeavid Laurali Goddard, MD 10/15/16 [id number]

## 2016-10-15 NOTE — ED Notes (Signed)
Dr. Preston FleetingGlick notified on pt.'s elevated Lactic Acid and hypotension .

## 2016-10-15 NOTE — Progress Notes (Addendum)
Paged Marlowe KaysSara Wertman, PA per patient's wife request. Wife reported 2 tick bites to left hip this summer that stayed infected until recently. Two spots visibly noted by RN. RN informed PA of this news. Will continue to monitor.
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
## 2016-10-15 NOTE — Progress Notes (Addendum)
Pharmacy Antibiotic Note  Rickey Rivera is a 65 y.o. male admitted on 10/15/2016 with SOB. Patient [facility] with [facility] with blood cultures showing CONS (1/4 bottles) on 10/08/2016 (it appears the patient was on ceftriaxone and vancomycin; last dose of vancomycin was on 12/7). Pharmacy has been consulted for vancomycin and cefepime dosing. -WBC= 9.9, LA= 2.3, SCr= 1.08 and CrCl ~ 70 -cefepime 2gm given at ~ 7am today and vancomycin 1000mg  given at ~ 7am  Plan: -Cefepime 1gm IV q8h -vancomycin 1000mg  (for a total load of 2000mg ) followed by 1000mg  IV q12h -Will follow renal function, cultures and clinical progress     Temp (24hrs), Avg:98.3 F (36.8 C), Min:97.8 F (36.6 C), Max:98.7 F (37.1 C)   Recent Labs Lab 10/15/16 0055 10/15/16 0101 10/15/16 0143  WBC 9.9  --   --   CREATININE 1.08  --   --   LATICACIDVEN  --  2.38* 2.3*    CrCl cannot be calculated (Unknown ideal weight.).    Allergies  Allergen Reactions  . Celexa [Citalopram] Other (See Comments)    Reaction (?)  . Wellbutrin [Bupropion] Other (See Comments)    Reaction (?)    Antimicrobials this admission: 12/11 vanc>> 12/11 cefepime>>  Dose adjustments this admission:   Microbiology results: 12/11 blood x2  Thank you for allowing pharmacy to be a part of this patient's care.  Harland GermanAndrew Athalene Kolle, Pharm D 10/15/2016 8:45 AM

## 2016-10-15 NOTE — Progress Notes (Signed)
  Echocardiogram 2D Echocardiogram has been performed.  Rickey Rivera 10/15/2016, 10:22 AM

## 2016-10-16 ENCOUNTER — Inpatient Hospital Stay (HOSPITAL_COMMUNITY): Payer: BC Managed Care – PPO

## 2016-10-16 DIAGNOSIS — G588 Other specified mononeuropathies: Secondary | ICD-10-CM

## 2016-10-16 DIAGNOSIS — F419 Anxiety disorder, unspecified: Secondary | ICD-10-CM

## 2016-10-16 DIAGNOSIS — I959 Hypotension, unspecified: Secondary | ICD-10-CM

## 2016-10-16 DIAGNOSIS — G629 Polyneuropathy, unspecified: Secondary | ICD-10-CM

## 2016-10-16 DIAGNOSIS — B3322 Viral myocarditis: Secondary | ICD-10-CM

## 2016-10-16 DIAGNOSIS — F323 Major depressive disorder, single episode, severe with psychotic features: Secondary | ICD-10-CM

## 2016-10-16 DIAGNOSIS — G43011 Migraine without aura, intractable, with status migrainosus: Secondary | ICD-10-CM

## 2016-10-16 DIAGNOSIS — I514 Myocarditis, unspecified: Secondary | ICD-10-CM

## 2016-10-16 LAB — URINE CULTURE: CULTURE: NO GROWTH

## 2016-10-16 LAB — COMPREHENSIVE METABOLIC PANEL
ALBUMIN: 2.2 g/dL — AB (ref 3.5–5.0)
ALT: 39 U/L (ref 17–63)
AST: 21 U/L (ref 15–41)
Alkaline Phosphatase: 212 U/L — ABNORMAL HIGH (ref 38–126)
Anion gap: 10 (ref 5–15)
BUN: 13 mg/dL (ref 6–20)
CHLORIDE: 107 mmol/L (ref 101–111)
CO2: 22 mmol/L (ref 22–32)
Calcium: 8.7 mg/dL — ABNORMAL LOW (ref 8.9–10.3)
Creatinine, Ser: 0.85 mg/dL (ref 0.61–1.24)
GFR calc Af Amer: >60 mL/min (ref >60)
GFR calc non Af Amer: >60 mL/min (ref >60)
GLUCOSE: 194 mg/dL — AB (ref 65–99)
POTASSIUM: 3.5 mmol/L (ref 3.5–5.1)
SODIUM: 139 mmol/L (ref 135–145)
Total Bilirubin: 0.7 mg/dL (ref 0.3–1.2)
Total Protein: 4.8 g/dL — ABNORMAL LOW (ref 6.5–8.1)

## 2016-10-16 LAB — CBC
HEMATOCRIT: 32.4 % — AB (ref 39.0–52.0)
HEMOGLOBIN: 10.5 g/dL — AB (ref 13.0–17.0)
MCH: 29.7 pg (ref 26.0–34.0)
MCHC: 32.4 g/dL (ref 30.0–36.0)
MCV: 91.8 fL (ref 78.0–100.0)
Platelets: 197 10*3/uL (ref 150–400)
RBC: 3.53 MIL/uL — ABNORMAL LOW (ref 4.22–5.81)
RDW: 14.5 % (ref 11.5–15.5)
WBC: 9 10*3/uL (ref 4.0–10.5)

## 2016-10-16 LAB — HEMOGLOBIN A1C
HEMOGLOBIN A1C: 5.9 % — AB (ref 4.8–5.6)
Mean Plasma Glucose: 123 mg/dL

## 2016-10-16 LAB — SEDIMENTATION RATE: SED RATE: 45 mm/h — AB (ref 0–16)

## 2016-10-16 LAB — C-REACTIVE PROTEIN: CRP: 10.4 mg/dL — ABNORMAL HIGH (ref <1.0)

## 2016-10-16 LAB — HIV ANTIBODY (ROUTINE TESTING W REFLEX): HIV Screen 4th Generation wRfx: NONREACTIVE

## 2016-10-16 MED ORDER — LORAZEPAM 2 MG/ML IJ SOLN
2.0000 mg | Freq: Once | INTRAMUSCULAR | Status: AC
  Administered 2016-10-17: 2 mg via INTRAVENOUS
  Filled 2016-10-16 (×2): qty 2

## 2016-10-16 MED ORDER — IPRATROPIUM-ALBUTEROL 0.5-2.5 (3) MG/3ML IN SOLN
3.0000 mL | Freq: Four times a day (QID) | RESPIRATORY_TRACT | Status: DC
  Filled 2016-10-16: qty 3

## 2016-10-16 MED ORDER — IPRATROPIUM-ALBUTEROL 0.5-2.5 (3) MG/3ML IN SOLN: 3.0000 mL | Freq: Four times a day (QID) | RESPIRATORY_TRACT | Status: DC | PRN
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
## 2016-10-16 NOTE — Progress Notes (Signed)
Nutrition Brief Note  RD consulted for assessment of nutritional needs/status (low albumin/prealbumin).  Wt Readings from Last 15 Encounters:  10/16/16 271 lb 13.2 oz (123.3 kg)   Rickey Rivera is a 65 y.o. male who presents with acute respiratory failure likely from systemic viral  infection. Unfortunately pt given steroids prior to administration of stress dose steroids. H/o Addisons in family though pt has never been tested. Recent workup at [facility] unremarkable other than 1 out of 2 + BCX (contaminant???). Repeat Cx drawn at time of admission. Repeat CXR in am. If not improving or worsening may need pulm consult.   Pt admitted with acute respiratory failure with hypoxia.   Pt was sleeping soundly at time of visit; did not arouse to participate in interview or exam.   Case discussed with RN, who endorses pt with good appetite. Observed pt meal tray; pt consumed approximately 50% of tray, likely due to sleepiness.   [facility] wt was 261# on 10/12/16. Suspect wt changes are related to fluid retention and steroids.   Nutrition-Focused physical exam completed. Findings are no fat depletion, no muscle depletion, and moderate edema.   Albumin has a half-life of 21 days and is strongly affected by stress response and inflammatory process, therefore, do not expect to see an improvement in this lab value during acute hospitalization.  When a patient presents with low albumin, it is likely skewed due to the acute inflammatory response.  Unless it is suspected that patient had poor PO intake or malnutrition prior to admission, then RD should not be consulted solely for low albumin. Note that low albumin is no longer used to diagnose malnutrition; Ashley uses the new malnutrition guidelines published by the American Society for Parenteral and Enteral Nutrition (A.S.P.E.N.) and the Academy of Nutrition and Dietetics (AND).    Body mass index is 41.33 kg/m. Patient meets  criteria for extreme obesity, class III based on current BMI.   Current diet order is Heart Healthy, patient is consuming approximately 50% of meals at this time. Labs and medications reviewed.   No nutrition interventions warranted at this time. If nutrition issues arise, please consult RD.   Diron Haddon A. Mayford KnifeWilliams, RD, LDN, CDE Pager: [phone] After hours Pager: [phone]

## 2016-10-17 ENCOUNTER — Inpatient Hospital Stay (HOSPITAL_COMMUNITY): Payer: BC Managed Care – PPO

## 2016-10-17 ENCOUNTER — Encounter (HOSPITAL_COMMUNITY): Payer: Self-pay

## 2016-10-17 DIAGNOSIS — I514 Myocarditis, unspecified: Secondary | ICD-10-CM

## 2016-10-17 LAB — COMPREHENSIVE METABOLIC PANEL
ALBUMIN: 2.3 g/dL — AB (ref 3.5–5.0)
ALK PHOS: 173 U/L — AB (ref 38–126)
ALT: 37 U/L (ref 17–63)
AST: 27 U/L (ref 15–41)
Anion gap: 8 (ref 5–15)
BUN: 12 mg/dL (ref 6–20)
CHLORIDE: 106 mmol/L (ref 101–111)
CO2: 26 mmol/L (ref 22–32)
Calcium: 9 mg/dL (ref 8.9–10.3)
Creatinine, Ser: 0.81 mg/dL (ref 0.61–1.24)
GFR calc Af Amer: >60 mL/min (ref >60)
GFR calc non Af Amer: >60 mL/min (ref >60)
GLUCOSE: 153 mg/dL — AB (ref 65–99)
POTASSIUM: 3.4 mmol/L — AB (ref 3.5–5.1)
SODIUM: 140 mmol/L (ref 135–145)
Total Bilirubin: 1 mg/dL (ref 0.3–1.2)
Total Protein: 4.7 g/dL — ABNORMAL LOW (ref 6.5–8.1)

## 2016-10-17 LAB — RHEUMATOID FACTOR: Rheumatoid fact SerPl-aCnc: 11.2 IU/mL (ref 0.0–13.9)

## 2016-10-17 LAB — HEPATITIS PANEL, ACUTE
HEP A IGM: NEGATIVE
HEP B C IGM: NEGATIVE
HEP B S AG: NEGATIVE

## 2016-10-17 LAB — ANGIOTENSIN CONVERTING ENZYME: Angiotensin-Converting Enzyme: 57 U/L (ref 14–82)

## 2016-10-17 LAB — MAGNESIUM: Magnesium: 2.2 mg/dL (ref 1.7–2.4)

## 2016-10-17 LAB — RPR: RPR Ser Ql: NONREACTIVE

## 2016-10-17 LAB — ANTINUCLEAR ANTIBODIES, IFA: ANA Ab, IFA: NEGATIVE

## 2016-10-17 LAB — ROCKY MTN SPOTTED FVR ABS PNL(IGG+IGM)
RMSF IgG: NEGATIVE
RMSF IgM: 0.28 index (ref 0.00–0.89)

## 2016-10-17 LAB — PROCALCITONIN: Procalcitonin: 3.93 ng/mL

## 2016-10-17 LAB — B. BURGDORFI ANTIBODIES: B burgdorferi Ab IgG+IgM: <0.91 {ISR} (ref 0.00–0.90)

## 2016-10-17 MED ORDER — POTASSIUM CHLORIDE CRYS ER 20 MEQ PO TBCR
40.0000 meq | EXTENDED_RELEASE_TABLET | Freq: Two times a day (BID) | ORAL | Status: AC
  Administered 2016-10-17 – 2016-10-18 (×3): 40 meq via ORAL
  Filled 2016-10-17 (×3): qty 2

## 2016-10-17 MED ORDER — GADOBENATE DIMEGLUMINE 529 MG/ML IV SOLN
35.0000 mL | Freq: Once | INTRAVENOUS | Status: AC
  Administered 2016-10-17: 35 mL via INTRAVENOUS

## 2016-10-17 MED ORDER — HYDROCORTISONE NA SUCCINATE PF 100 MG IJ SOLR
50.0000 mg | Freq: Four times a day (QID) | INTRAMUSCULAR | Status: DC
  Administered 2016-10-17 – 2016-10-18 (×2): 50 mg via INTRAVENOUS
  Filled 2016-10-17 (×2): qty 2

## 2016-10-17 NOTE — Progress Notes (Signed)
PT Cancellation Note  Patient Details Name: Rickey Rivera MRN: [id number] DOB: 12/16/1950   Cancelled Treatment:    Reason Eval/Treat Not Completed: Patient at procedure or test/unavailable. Pt off of floor for cardiac MRI at this time. PT will continue to f/u with pt as appropriate.   Alessandra BevelsJennifer M Heman Que 10/17/2016, 10:17 AM Rickey ChalkJennifer Aya Geisel, PT, DPT [phone]
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
## 2016-10-18 ENCOUNTER — Encounter (HOSPITAL_COMMUNITY): Payer: Self-pay | Admitting: Nurse Practitioner

## 2016-10-18 DIAGNOSIS — I480 Paroxysmal atrial fibrillation: Secondary | ICD-10-CM

## 2016-10-18 LAB — CBC
HCT: 32.1 % — ABNORMAL LOW (ref 39.0–52.0)
Hemoglobin: 10.3 g/dL — ABNORMAL LOW (ref 13.0–17.0)
MCH: 29.1 pg (ref 26.0–34.0)
MCHC: 32.1 g/dL (ref 30.0–36.0)
MCV: 90.7 fL (ref 78.0–100.0)
PLATELETS: 276 10*3/uL (ref 150–400)
RBC: 3.54 MIL/uL — ABNORMAL LOW (ref 4.22–5.81)
RDW: 14 % (ref 11.5–15.5)
WBC: 7.9 10*3/uL (ref 4.0–10.5)

## 2016-10-18 LAB — COMPREHENSIVE METABOLIC PANEL
ALK PHOS: 161 U/L — AB (ref 38–126)
ALT: 33 U/L (ref 17–63)
AST: 20 U/L (ref 15–41)
Albumin: 2.2 g/dL — ABNORMAL LOW (ref 3.5–5.0)
Anion gap: 13 (ref 5–15)
BUN: 12 mg/dL (ref 6–20)
CALCIUM: 8.8 mg/dL — AB (ref 8.9–10.3)
CHLORIDE: 103 mmol/L (ref 101–111)
CO2: 24 mmol/L (ref 22–32)
CREATININE: 0.75 mg/dL (ref 0.61–1.24)
GFR calc Af Amer: >60 mL/min (ref >60)
Glucose, Bld: 160 mg/dL — ABNORMAL HIGH (ref 65–99)
Potassium: 3.4 mmol/L — ABNORMAL LOW (ref 3.5–5.1)
SODIUM: 140 mmol/L (ref 135–145)
Total Bilirubin: 0.5 mg/dL (ref 0.3–1.2)
Total Protein: 4.7 g/dL — ABNORMAL LOW (ref 6.5–8.1)

## 2016-10-18 LAB — TROPONIN I: TROPONIN I: 0.03 ng/mL — AB (ref <0.03)

## 2016-10-18 MED ORDER — HYDROCORTISONE NA SUCCINATE PF 100 MG IJ SOLR
50.0000 mg | Freq: Three times a day (TID) | INTRAMUSCULAR | Status: DC
  Administered 2016-10-18 – 2016-10-19 (×3): 50 mg via INTRAVENOUS
  Filled 2016-10-18 (×3): qty 2

## 2016-10-18 MED ORDER — LEVOFLOXACIN 750 MG PO TABS
750.0000 mg | ORAL_TABLET | Freq: Every day | ORAL | Status: DC
  Administered 2016-10-18 – 2016-10-21 (×4): 750 mg via ORAL
  Filled 2016-10-18 (×4): qty 1

## 2016-10-18 NOTE — Progress Notes (Signed)
Pharmacy Antibiotic Note  Rickey Rivera is a 65 y.o. male admitted on 10/15/2016 with SOB. Patient noted with [facility] with blood cultures showing CONS (1/4 bottles) on 10/08/2016 (it appears the patient was [facility] and [facility] Patient is on  vancomycin and cefepime and now to change to levaquin.  -WBC= 7.9, SCr= 0.75 and CrCl > 100 -cefepime 2gm given at ~ 7am today and vancomycin 1000mg  given at ~ 7am  Plan: -Levaquin 750mg  po q24hr -Will sign off. Please contact pharmacy with any other needs.   Height: 5\' 8"  (172.7 cm) Weight: 272 lb (123.4 kg) IBW/kg (Calculated) : 68.4  Temp (24hrs), Avg:98.2 F (36.8 C), Min:97.5 F (36.4 C), Max:98.8 F (37.1 C)   Recent Labs Lab 10/15/16 0055 10/15/16 0101 10/15/16 0143 10/16/16 0309 10/17/16 1036 10/18/16 0250  WBC 9.9  --   --  9.0  --  7.9  CREATININE 1.08  --   --  0.85 0.81 0.75  LATICACIDVEN  --  2.38* 2.3*  --   --   --     Estimated Creatinine Clearance: 120.8 mL/min (by C-G formula based on SCr of 0.75 mg/dL).    Allergies  Allergen Reactions  . Celexa [Citalopram] Other (See Comments)    Reaction (?)  . Wellbutrin [Bupropion] Other (See Comments)    Reaction (?)    Antimicrobials this admission: 12/14 LQ>> 12/11 vanc>> 12/14 12/11 cefepime>> 12/14  Dose adjustments this admission:   Microbiology results: 12/11 urine- neg 12/11 blood x2- ngtd 12/11 MRSA PCR- neg  Thank you for allowing pharmacy to be a part of this patient's care.  Harland GermanAndrew Lynleigh Kovack, Pharm D 10/18/2016 10:17 AM
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
## 2016-10-19 LAB — COMPREHENSIVE METABOLIC PANEL
ALBUMIN: 2.2 g/dL — AB (ref 3.5–5.0)
ALT: 36 U/L (ref 17–63)
ANION GAP: 6 (ref 5–15)
AST: 38 U/L (ref 15–41)
Alkaline Phosphatase: 151 U/L — ABNORMAL HIGH (ref 38–126)
BUN: 13 mg/dL (ref 6–20)
CO2: 30 mmol/L (ref 22–32)
Calcium: 8.8 mg/dL — ABNORMAL LOW (ref 8.9–10.3)
Chloride: 102 mmol/L (ref 101–111)
Creatinine, Ser: 0.9 mg/dL (ref 0.61–1.24)
GFR calc Af Amer: >60 mL/min (ref >60)
GFR calc non Af Amer: >60 mL/min (ref >60)
GLUCOSE: 168 mg/dL — AB (ref 65–99)
Potassium: 4.5 mmol/L (ref 3.5–5.1)
SODIUM: 138 mmol/L (ref 135–145)
Total Bilirubin: 1 mg/dL (ref 0.3–1.2)
Total Protein: 4.6 g/dL — ABNORMAL LOW (ref 6.5–8.1)

## 2016-10-19 LAB — CBC
HCT: 33.4 % — ABNORMAL LOW (ref 39.0–52.0)
Hemoglobin: 10.7 g/dL — ABNORMAL LOW (ref 13.0–17.0)
MCH: 29.9 pg (ref 26.0–34.0)
MCHC: 32 g/dL (ref 30.0–36.0)
MCV: 93.3 fL (ref 78.0–100.0)
Platelets: 255 10*3/uL (ref 150–400)
RBC: 3.58 MIL/uL — ABNORMAL LOW (ref 4.22–5.81)
RDW: 14.2 % (ref 11.5–15.5)
WBC: 6.1 10*3/uL (ref 4.0–10.5)

## 2016-10-19 LAB — PROCALCITONIN: PROCALCITONIN: 1.06 ng/mL

## 2016-10-19 MED ORDER — HYDROCORTISONE NA SUCCINATE PF 100 MG IJ SOLR
25.0000 mg | Freq: Two times a day (BID) | INTRAMUSCULAR | Status: DC
  Administered 2016-10-19 – 2016-10-20 (×2): 25 mg via INTRAVENOUS
  Filled 2016-10-19 (×2): qty 2
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
## 2016-10-19 NOTE — Progress Notes (Signed)
   10/19/16 1430  Clinical Encounter Type  Visited With Patient and family together  Visit Type Follow-up  Spiritual Encounters  Spiritual Needs Literature  Stress Factors  Patient Stress Factors Health changes  Family Stress Factors Family relationships  Introduction follow up to Pt and wife. Notary and witnesses contacted to finalize Advance Directive. Original and copies to Pt and wife. Copy to chart.
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
## 2016-10-19 NOTE — Progress Notes (Signed)
Patient Name: Rickey Rivera Date of Encounter: 10/19/2016  Primary Cardiologist: new  Hospital Problem List     Active Problems:   Hypotension   Anxiety   Depression   Disease of thyroid gland   Hypertension   Hyperlipidemia   Obesity   Acute respiratory failure with hypoxia (HCC)   CAP (community acquired pneumonia)   Elevated brain natriuretic peptide (BNP) level   Elevated liver enzymes   Myocarditis (HCC)   Intractable migraine without aura and with status migrainosus   Peripheral neuropathy (HCC)   PAF (paroxysmal atrial fibrillation) (HCC)     Subjective   Feels well.  Wants to go home.   Inpatient Medications    Scheduled Meds: . donepezil  10 mg Oral QHS  . enoxaparin (LOVENOX) injection  40 mg Subcutaneous Q24H  . gabapentin  300 mg Oral Daily   And  . gabapentin  900 mg Oral QHS  . hydrocortisone sod succinate (SOLU-CORTEF) inj  25 mg Intravenous Q12H  . Influenza vac split quadrivalent PF  0.5 mL Intramuscular Tomorrow-1000  . levofloxacin  750 mg Oral Daily  . levothyroxine  100 mcg Oral QAC breakfast  . propranolol ER  160 mg Oral Daily  . simvastatin  40 mg Oral Daily  . sodium chloride flush  3 mL Intravenous Q12H  . venlafaxine XR  75 mg Oral Q breakfast   Continuous Infusions: . sodium chloride 50 mL/hr at 10/16/16 2338   PRN Meds: acetaminophen **OR** acetaminophen, bisacodyl, diclofenac, HYDROcodone-acetaminophen, ipratropium-albuterol, magnesium citrate, ondansetron **OR** ondansetron (ZOFRAN) IV, senna-docusate, SUMAtriptan, temazepam   Vital Signs    Vitals:   10/19/16 0800 10/19/16 0900 10/19/16 1000 10/19/16 1100  BP:      Pulse: (!) 50 (!) 59 75 67  Resp: 12 (!) 21 17 15   Temp:      TempSrc:      SpO2: 94% 93% 94% 94%  Weight:      Height:        Intake/Output Summary (Last 24 hours) at 10/19/16 1113 Last data filed at 10/19/16 0300  Gross per 24 hour  Intake              240 ml  Output              775 ml  Net              -535 ml   Filed Weights   10/17/16 0500 10/18/16 0304 10/19/16 0500  Weight: 271 lb 11.2 oz (123.2 kg) 272 lb (123.4 kg) 269 lb 6.4 oz (122.2 kg)    Physical Exam   GEN: Well nourished, well developed, in no acute distress.  HEENT: Grossly normal.  Neck: Supple, no JVD, carotid bruits, or masses. Cardiac: RRR, no murmurs, rubs, or gallops. No clubbing, cyanosis, edema.  Radials/DP/PT 2+ and equal bilaterally.  Respiratory:  Respirations regular and unlabored, clear to auscultation bilaterally. GI: Soft, nontender, nondistended, BS + x 4.obese MS: no deformity or atrophy. Skin: warm and dry, no rash. Neuro:  Strength and sensation are intact. Psych: AAOx3.  Normal affect.  Labs    CBC  Recent Labs  10/18/16 0250 10/19/16 0306  WBC 7.9 6.1  HGB 10.3* 10.7*  HCT 32.1* 33.4*  MCV 90.7 93.3  PLT 276 255   Basic Metabolic Panel  Recent Labs  10/17/16 1036 10/18/16 0250 10/19/16 0306  NA 140 140 138  K 3.4* 3.4* 4.5  CL 106 103 102  CO2 26 24 30  GLUCOSE 153* 160* 168*  BUN 12 12 13   CREATININE 0.81 0.75 0.90  CALCIUM 9.0 8.8* 8.8*  MG 2.2  --   --    Liver Function Tests  Recent Labs  10/18/16 0250 10/19/16 0306  AST 20 38  ALT 33 36  ALKPHOS 161* 151*  BILITOT 0.5 1.0  PROT 4.7* 4.6*  ALBUMIN 2.2* 2.2*   No results for input(s): LIPASE, AMYLASE in the last 72 hours. Cardiac Enzymes  Recent Labs  10/18/16 0250  TROPONINI 0.03*   BNP Invalid input(s): POCBNP D-Dimer No results for input(s): DDIMER in the last 72 hours. Hemoglobin A1C No results for input(s): HGBA1C in the last 72 hours. Fasting Lipid Panel No results for input(s): CHOL, HDL, LDLCALC, TRIG, CHOLHDL, LDLDIRECT in the last 72 hours. Thyroid Function Tests No results for input(s): TSH, T4TOTAL, T3FREE, THYROIDAB in the last 72 hours.  Invalid input(s): FREET3  Telemetry    NSR - Personally Reviewed  ECG    AFib on prior ECG - Personally Reviewed  Radiology      No results found.  Cardiac Studies   Normal LV function  Patient Profile       Assessment & Plan    1) AFib: No further AFib.  Discussed aspirin therapy, 81 mg daily.  He will discuss this with his regular MD.  He thinks he was told not to take ASA in the past.  ? Due to taking diclofenac?  Still plan for OP stress test in the future.  He needs to manage OSA as aggressively as possible. Consider CPAP. This may help with AFib.  Will sign off.  Signed, Lance MussJayadeep Vendetta Pittinger, MD  10/19/2016, 11:13 AM

## 2016-10-19 NOTE — Progress Notes (Signed)
SATURATION QUALIFICATIONS: (This note is used to comply with regulatory documentation for home oxygen)  Patient Saturations on Room Air at Rest = 100%  Patient Saturations on Room Air while Ambulating = >93%  Patient Saturations on 0 Liters of oxygen while Ambulating = >93%  Please briefly explain why patient needs home oxygen: NA  Cohl Behrens, PT, DPT [phone]  

## 2016-10-19 NOTE — Progress Notes (Signed)
Patient being transferred to 2W02 report called to [facility].  Henchy Mccauley, Kae HellerMiranda Lynn, RN

## 2016-10-19 NOTE — Progress Notes (Signed)
Pt received from 4N. Pt oriented to room and equipment. VSS. Telemetry applied, CCMD notified. Call light within reach, will continue to monitor.   Leonidas Rombergaitlin S Bumbledare, RN

## 2016-10-20 DIAGNOSIS — J181 Lobar pneumonia, unspecified organism: Secondary | ICD-10-CM

## 2016-10-20 DIAGNOSIS — E785 Hyperlipidemia, unspecified: Secondary | ICD-10-CM

## 2016-10-20 DIAGNOSIS — I480 Paroxysmal atrial fibrillation: Secondary | ICD-10-CM

## 2016-10-20 DIAGNOSIS — F321 Major depressive disorder, single episode, moderate: Secondary | ICD-10-CM

## 2016-10-20 DIAGNOSIS — J9601 Acute respiratory failure with hypoxia: Secondary | ICD-10-CM

## 2016-10-20 LAB — CULTURE, BLOOD (ROUTINE X 2)
CULTURE: NO GROWTH
CULTURE: NO GROWTH
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
## 2016-10-21 DIAGNOSIS — I9589 Other hypotension: Secondary | ICD-10-CM

## 2016-10-21 DIAGNOSIS — G4733 Obstructive sleep apnea (adult) (pediatric): Secondary | ICD-10-CM

## 2016-10-21 DIAGNOSIS — G43909 Migraine, unspecified, not intractable, without status migrainosus: Secondary | ICD-10-CM

## 2016-10-21 DIAGNOSIS — G63 Polyneuropathy in diseases classified elsewhere: Secondary | ICD-10-CM

## 2016-10-21 DIAGNOSIS — R7303 Prediabetes: Secondary | ICD-10-CM

## 2016-10-21 DIAGNOSIS — E6609 Other obesity due to excess calories: Secondary | ICD-10-CM

## 2016-10-21 DIAGNOSIS — G43011 Migraine without aura, intractable, with status migrainosus: Secondary | ICD-10-CM

## 2016-10-21 DIAGNOSIS — Z6841 Body Mass Index (BMI) 40.0 and over, adult: Secondary | ICD-10-CM

## 2016-10-21 LAB — CBC
HCT: 37.1 % — ABNORMAL LOW (ref 39.0–52.0)
HEMOGLOBIN: 11.8 g/dL — AB (ref 13.0–17.0)
MCH: 29.8 pg (ref 26.0–34.0)
MCHC: 31.8 g/dL (ref 30.0–36.0)
MCV: 93.7 fL (ref 78.0–100.0)
PLATELETS: 238 10*3/uL (ref 150–400)
RBC: 3.96 MIL/uL — AB (ref 4.22–5.81)
RDW: 14.3 % (ref 11.5–15.5)
WBC: 5.3 10*3/uL (ref 4.0–10.5)

## 2016-10-21 MED ORDER — LEVOFLOXACIN 750 MG PO TABS: 750.0000 mg | ORAL_TABLET | Freq: Every day | ORAL | 0 refills | Status: DC
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
## 2016-10-21 NOTE — Progress Notes (Signed)
Rickey Rivera to be D/C'd Home per MD order. Discussed with the patient and all questions fully answered.  Allergies as of 10/21/2016      Reactions   Celexa [citalopram] Other (See Comments)   Reaction (?)   Wellbutrin [bupropion] Other (See Comments)   Reaction (?)      Medication List    TAKE these medications   acetaminophen 325 MG tablet Commonly known as:  TYLENOL Take 325-650 mg by mouth every 6 (six) hours as needed for fever or headache.   diclofenac 75 MG EC tablet Commonly known as:  VOLTAREN Take 75 mg by mouth See admin instructions. One to two times a day as needed for pain   donepezil 10 MG tablet Commonly known as:  ARICEPT Take 10 mg by mouth at bedtime.   gabapentin 300 MG capsule Commonly known as:  NEURONTIN Take 300-900 mg by mouth See admin instructions. 300 mg in the morning and 900 mg at bedtime (may also take an additional 300 mg during the day, if needed for anxiety and/or headaches)   levofloxacin 750 MG tablet Commonly known as:  LEVAQUIN Take 1 tablet (750 mg total) by mouth daily. Start taking on:  10/22/2016   levothyroxine 100 MCG tablet Commonly known as:  SYNTHROID, LEVOTHROID Take 100 mcg by mouth daily before breakfast.   propranolol ER 160 MG SR capsule Commonly known as:  INDERAL LA Take 160 mg by mouth daily.   simvastatin 40 MG tablet Commonly known as:  ZOCOR Take 40 mg by mouth daily.   SUMAtriptan 100 MG tablet Commonly known as:  IMITREX Take 100 mg by mouth once as needed for migraine or headache (may repeat once in 2 hours (if no relief)). May repeat in 2 hours if headache persists or recurs.   temazepam 30 MG capsule Commonly known as:  RESTORIL Take 30 mg by mouth at bedtime as needed for sleep.   venlafaxine XR 75 MG 24 hr capsule Commonly known as:  EFFEXOR-XR Take 75 mg by mouth daily with breakfast.       VVS, Skin clean, dry and intact without evidence of skin break down, no evidence of skin tears noted.   IV catheter discontinued intact. Site without signs and symptoms of complications. Dressing and pressure applied.  An After Visit Summary was printed and given to the patient.  Patient escorted via WC, and D/C home via private auto.  Rickey Rivera, Rickey Rivera  10/21/2016 6:02 PM
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
## 2020-09-06 ENCOUNTER — Encounter: Payer: Self-pay | Admitting: Emergency Medicine

## 2020-09-06 ENCOUNTER — Ambulatory Visit
Admission: EM | Admit: 2020-09-06 | Discharge: 2020-09-06 | Disposition: A | Discharge status: Home / Self Care | Payer: Non-veteran care | Attending: Emergency Medicine | Admitting: Emergency Medicine

## 2020-09-06 ENCOUNTER — Other Ambulatory Visit: Payer: Self-pay

## 2020-09-06 DIAGNOSIS — M5442 Lumbago with sciatica, left side: Secondary | ICD-10-CM | POA: Diagnosis not present

## 2020-09-06 DIAGNOSIS — M5441 Lumbago with sciatica, right side: Secondary | ICD-10-CM | POA: Diagnosis not present

## 2020-09-06 MED ORDER — METHYLPREDNISOLONE SODIUM SUCC 125 MG IJ SOLR
125.0000 mg | Freq: Once | INTRAMUSCULAR | Status: AC
  Administered 2020-09-06: 125 mg via INTRAMUSCULAR
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
## 2020-11-05 HISTORY — PX: CERVICAL FUSION: SHX112

## 2021-02-06 ENCOUNTER — Other Ambulatory Visit (HOSPITAL_COMMUNITY): Payer: Self-pay | Admitting: Neurosurgery

## 2021-02-06 DIAGNOSIS — M5416 Radiculopathy, lumbar region: Secondary | ICD-10-CM

## 2021-02-12 ENCOUNTER — Encounter (HOSPITAL_COMMUNITY): Payer: Self-pay

## 2021-02-12 ENCOUNTER — Ambulatory Visit (HOSPITAL_COMMUNITY): Admission: RE | Admit: 2021-02-12 | Payer: Non-veteran care | Source: Ambulatory Visit

## 2021-03-27 ENCOUNTER — Ambulatory Visit
Payer: No Typology Code available for payment source | Attending: Neurosurgery | Admitting: Rehabilitative and Restorative Service Providers"

## 2021-03-27 ENCOUNTER — Other Ambulatory Visit: Payer: Self-pay

## 2021-03-27 ENCOUNTER — Encounter: Payer: Self-pay | Admitting: Rehabilitative and Restorative Service Providers"

## 2021-03-27 DIAGNOSIS — M6281 Muscle weakness (generalized): Secondary | ICD-10-CM | POA: Insufficient documentation

## 2021-03-27 DIAGNOSIS — M542 Cervicalgia: Secondary | ICD-10-CM | POA: Insufficient documentation

## 2021-03-27 DIAGNOSIS — R262 Difficulty in walking, not elsewhere classified: Secondary | ICD-10-CM | POA: Insufficient documentation

## 2021-03-27 DIAGNOSIS — G8929 Other chronic pain: Secondary | ICD-10-CM | POA: Insufficient documentation

## 2021-03-27 DIAGNOSIS — R2689 Other abnormalities of gait and mobility: Secondary | ICD-10-CM | POA: Diagnosis present

## 2021-03-27 DIAGNOSIS — M545 Low back pain, unspecified: Secondary | ICD-10-CM | POA: Insufficient documentation

## 2021-03-27 DIAGNOSIS — M62830 Muscle spasm of back: Secondary | ICD-10-CM | POA: Diagnosis present
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
## 2021-03-27 NOTE — Patient Instructions (Signed)
Access Code: [email or web address] URL: [email or web address] Date: 03/27/2021 Prepared by: Reather Laurence  Exercises Seated Ankle Pumps on Table - 1-2 x daily - 7 x weekly - 2 sets - 10 reps Seated March - 1-2 x daily - 7 x weekly - 2 sets - 10 reps Seated Long Arc Quad - 1-2 x daily - 7 x weekly - 2 sets - 10 reps Sit to Stand - 1 x daily - 7 x weekly - 1 sets - 10 reps

## 2021-03-29 ENCOUNTER — Encounter: Payer: Self-pay | Admitting: Rehabilitative and Restorative Service Providers"

## 2021-03-29 ENCOUNTER — Other Ambulatory Visit: Payer: Self-pay

## 2021-03-29 ENCOUNTER — Ambulatory Visit: Payer: No Typology Code available for payment source | Admitting: Rehabilitative and Restorative Service Providers"

## 2021-03-29 DIAGNOSIS — M542 Cervicalgia: Secondary | ICD-10-CM

## 2021-03-29 DIAGNOSIS — R2689 Other abnormalities of gait and mobility: Secondary | ICD-10-CM

## 2021-03-29 DIAGNOSIS — R262 Difficulty in walking, not elsewhere classified: Secondary | ICD-10-CM

## 2021-03-29 DIAGNOSIS — M62830 Muscle spasm of back: Secondary | ICD-10-CM

## 2021-03-29 DIAGNOSIS — M6281 Muscle weakness (generalized): Secondary | ICD-10-CM

## 2021-03-29 DIAGNOSIS — G8929 Other chronic pain: Secondary | ICD-10-CM

## 2021-03-29 DIAGNOSIS — M545 Low back pain, unspecified: Secondary | ICD-10-CM
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
## 2021-04-10 ENCOUNTER — Other Ambulatory Visit: Payer: Self-pay

## 2021-04-10 ENCOUNTER — Ambulatory Visit
Payer: No Typology Code available for payment source | Attending: Neurosurgery | Admitting: Rehabilitative and Restorative Service Providers"

## 2021-04-10 ENCOUNTER — Encounter: Payer: Self-pay | Admitting: Rehabilitative and Restorative Service Providers"

## 2021-04-10 DIAGNOSIS — R2689 Other abnormalities of gait and mobility: Secondary | ICD-10-CM | POA: Diagnosis present

## 2021-04-10 DIAGNOSIS — M62830 Muscle spasm of back: Secondary | ICD-10-CM | POA: Insufficient documentation

## 2021-04-10 DIAGNOSIS — M6281 Muscle weakness (generalized): Secondary | ICD-10-CM | POA: Insufficient documentation

## 2021-04-10 DIAGNOSIS — G8929 Other chronic pain: Secondary | ICD-10-CM | POA: Insufficient documentation

## 2021-04-10 DIAGNOSIS — M545 Low back pain, unspecified: Secondary | ICD-10-CM | POA: Diagnosis present

## 2021-04-10 DIAGNOSIS — R262 Difficulty in walking, not elsewhere classified: Secondary | ICD-10-CM | POA: Insufficient documentation

## 2021-04-10 DIAGNOSIS — M542 Cervicalgia: Secondary | ICD-10-CM | POA: Diagnosis not present
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
## 2021-04-12 ENCOUNTER — Encounter: Payer: Self-pay | Admitting: Rehabilitative and Restorative Service Providers"

## 2021-04-12 ENCOUNTER — Other Ambulatory Visit: Payer: Self-pay

## 2021-04-12 ENCOUNTER — Ambulatory Visit: Payer: No Typology Code available for payment source | Admitting: Rehabilitative and Restorative Service Providers"

## 2021-04-12 DIAGNOSIS — R262 Difficulty in walking, not elsewhere classified: Secondary | ICD-10-CM

## 2021-04-12 DIAGNOSIS — M6281 Muscle weakness (generalized): Secondary | ICD-10-CM

## 2021-04-12 DIAGNOSIS — R2689 Other abnormalities of gait and mobility: Secondary | ICD-10-CM

## 2021-04-12 DIAGNOSIS — M62830 Muscle spasm of back: Secondary | ICD-10-CM

## 2021-04-12 DIAGNOSIS — M542 Cervicalgia: Secondary | ICD-10-CM | POA: Diagnosis not present

## 2021-04-12 DIAGNOSIS — G8929 Other chronic pain: Secondary | ICD-10-CM
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
## 2021-04-17 ENCOUNTER — Ambulatory Visit: Payer: No Typology Code available for payment source | Admitting: Rehabilitative and Restorative Service Providers"

## 2021-04-17 ENCOUNTER — Other Ambulatory Visit: Payer: Self-pay

## 2021-04-17 ENCOUNTER — Encounter: Payer: Self-pay | Admitting: Rehabilitative and Restorative Service Providers"

## 2021-04-17 DIAGNOSIS — M542 Cervicalgia: Secondary | ICD-10-CM

## 2021-04-17 DIAGNOSIS — R2689 Other abnormalities of gait and mobility: Secondary | ICD-10-CM

## 2021-04-17 DIAGNOSIS — M62830 Muscle spasm of back: Secondary | ICD-10-CM

## 2021-04-17 DIAGNOSIS — M6281 Muscle weakness (generalized): Secondary | ICD-10-CM

## 2021-04-17 DIAGNOSIS — M545 Low back pain, unspecified: Secondary | ICD-10-CM

## 2021-04-17 DIAGNOSIS — R262 Difficulty in walking, not elsewhere classified: Secondary | ICD-10-CM
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
## 2021-04-19 ENCOUNTER — Other Ambulatory Visit: Payer: Self-pay

## 2021-04-19 ENCOUNTER — Ambulatory Visit: Payer: No Typology Code available for payment source | Admitting: Rehabilitative and Restorative Service Providers"

## 2021-04-19 ENCOUNTER — Encounter: Payer: Self-pay | Admitting: Rehabilitative and Restorative Service Providers"

## 2021-04-19 DIAGNOSIS — M542 Cervicalgia: Secondary | ICD-10-CM

## 2021-04-19 DIAGNOSIS — R2689 Other abnormalities of gait and mobility: Secondary | ICD-10-CM

## 2021-04-19 DIAGNOSIS — G8929 Other chronic pain: Secondary | ICD-10-CM

## 2021-04-19 DIAGNOSIS — M62830 Muscle spasm of back: Secondary | ICD-10-CM

## 2021-04-19 DIAGNOSIS — M6281 Muscle weakness (generalized): Secondary | ICD-10-CM

## 2021-04-19 DIAGNOSIS — R262 Difficulty in walking, not elsewhere classified: Secondary | ICD-10-CM
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
## 2021-04-20 ENCOUNTER — Telehealth: Payer: Self-pay

## 2021-04-20 ENCOUNTER — Other Ambulatory Visit: Payer: Self-pay | Admitting: Neurosurgery

## 2021-04-20 DIAGNOSIS — M4712 Other spondylosis with myelopathy, cervical region: Secondary | ICD-10-CM

## 2021-04-20 NOTE — Telephone Encounter (Addendum)
Spoke with patient for [place] to screen his medications before getting him scheduled for a cervical myelogram.  He stated an understanding that he will be here two hours and will need a driver.  He also stated an understanding that he needs to hold Imitrex, Venlafaxine and Trazodone for 48 hours before, and 24 hours after, the myelogram, as well as Aspirin for three days before.

## 2021-05-02 ENCOUNTER — Ambulatory Visit
Admission: RE | Admit: 2021-05-02 | Discharge: 2021-05-02 | Disposition: A | Discharge status: Home / Self Care | Payer: Non-veteran care | Source: Ambulatory Visit | Attending: Neurosurgery | Admitting: Neurosurgery

## 2021-05-02 DIAGNOSIS — M4712 Other spondylosis with myelopathy, cervical region: Secondary | ICD-10-CM

## 2021-05-02 MED ORDER — MEPERIDINE HCL 50 MG/ML IJ SOLN
50.0000 mg | Freq: Once | INTRAMUSCULAR | Status: AC
  Administered 2021-05-02: 10:00:00 50 mg via INTRAMUSCULAR

## 2021-05-02 MED ORDER — DIAZEPAM 5 MG PO TABS
5.0000 mg | ORAL_TABLET | Freq: Once | ORAL | Status: AC
  Administered 2021-05-02: 09:00:00 5 mg via ORAL

## 2021-05-02 MED ORDER — ONDANSETRON HCL 4 MG/2ML IJ SOLN
4.0000 mg | Freq: Once | INTRAMUSCULAR | Status: AC
  Administered 2021-05-02: 10:00:00 4 mg via INTRAMUSCULAR

## 2021-05-02 MED ORDER — IOPAMIDOL (ISOVUE-M 300) INJECTION 61%
10.0000 mL | Freq: Once | INTRAMUSCULAR | Status: AC | PRN
  Administered 2021-05-02: 10:00:00 10 mL via INTRATHECAL

## 2021-05-02 NOTE — Discharge Instr - Other Orders (Signed)
1015: pt reports pain 8/10 in neck and back from myelogram procedure. See MAR.

## 2021-05-02 NOTE — Discharge Instructions (Addendum)
Myelogram Discharge Instructions  Go home and rest quietly as needed. You may resume normal activities; however, do not exert yourself strongly or do any heavy lifting today and tomorrow.   DO NOT drive today.    You may resume your normal diet and medications unless otherwise indicated. Drink lots of extra fluids today and tomorrow.   The incidence of headache, nausea, or vomiting is about 5% (one in 20 patients).  If you develop a headache, lie flat for 24 hours and drink plenty of fluids until the headache goes away.  Caffeinated beverages may be helpful. If when you get up you still have a headache when standing, go back to bed and force fluids for another 24 hours.   If you develop severe nausea and vomiting or a headache that does not go away with the flat bedrest after 48 hours, please call [phone].   Call your physician for a follow-up appointment.  The results of your myelogram will be sent directly to your physician by the following day.  If you have any questions or if complications develop after you arrive home, please call [phone].  Discharge instructions have been explained to the patient.  The patient, or the person responsible for the patient, fully understands these instructions.   Thank you for visiting our office today.    YOU MAY RESUME YOUR ASPIRIN ANYTIME AFTER PROCEDURE TODAY. YOU MAY RESUME YOUR EFFEXOR, IMITREX, AND TRAZODONE TOMORROW 05/03/21 AT 9:30 AM.

## 2021-05-02 NOTE — Progress Notes (Signed)
Patient states he has been off Effexor, Imitrex and Trazodone for at least the past 48 hours.

## 2021-05-03 ENCOUNTER — Ambulatory Visit: Payer: No Typology Code available for payment source | Admitting: Rehabilitative and Restorative Service Providers"

## 2021-05-05 ENCOUNTER — Encounter: Payer: Self-pay | Admitting: Rehabilitative and Restorative Service Providers"

## 2021-05-05 ENCOUNTER — Other Ambulatory Visit: Payer: Self-pay

## 2021-05-05 ENCOUNTER — Ambulatory Visit
Payer: No Typology Code available for payment source | Attending: Neurosurgery | Admitting: Rehabilitative and Restorative Service Providers"

## 2021-05-05 DIAGNOSIS — R2689 Other abnormalities of gait and mobility: Secondary | ICD-10-CM | POA: Diagnosis present

## 2021-05-05 DIAGNOSIS — G8929 Other chronic pain: Secondary | ICD-10-CM | POA: Insufficient documentation

## 2021-05-05 DIAGNOSIS — M6281 Muscle weakness (generalized): Secondary | ICD-10-CM | POA: Insufficient documentation

## 2021-05-05 DIAGNOSIS — M542 Cervicalgia: Secondary | ICD-10-CM | POA: Insufficient documentation

## 2021-05-05 DIAGNOSIS — M62830 Muscle spasm of back: Secondary | ICD-10-CM | POA: Insufficient documentation

## 2021-05-05 DIAGNOSIS — R262 Difficulty in walking, not elsewhere classified: Secondary | ICD-10-CM | POA: Insufficient documentation

## 2021-05-05 DIAGNOSIS — M545 Low back pain, unspecified: Secondary | ICD-10-CM | POA: Diagnosis not present
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
## 2021-05-10 ENCOUNTER — Ambulatory Visit: Payer: No Typology Code available for payment source | Admitting: Rehabilitative and Restorative Service Providers"

## 2021-05-12 ENCOUNTER — Other Ambulatory Visit: Payer: Self-pay

## 2021-05-12 ENCOUNTER — Ambulatory Visit: Payer: No Typology Code available for payment source | Admitting: Rehabilitative and Restorative Service Providers"

## 2021-05-12 ENCOUNTER — Encounter: Payer: Self-pay | Admitting: Rehabilitative and Restorative Service Providers"

## 2021-05-12 DIAGNOSIS — G8929 Other chronic pain: Secondary | ICD-10-CM

## 2021-05-12 DIAGNOSIS — M6281 Muscle weakness (generalized): Secondary | ICD-10-CM

## 2021-05-12 DIAGNOSIS — R2689 Other abnormalities of gait and mobility: Secondary | ICD-10-CM

## 2021-05-12 DIAGNOSIS — M545 Low back pain, unspecified: Secondary | ICD-10-CM | POA: Diagnosis not present

## 2021-05-12 DIAGNOSIS — M542 Cervicalgia: Secondary | ICD-10-CM

## 2021-05-12 DIAGNOSIS — M62830 Muscle spasm of back: Secondary | ICD-10-CM

## 2021-05-12 DIAGNOSIS — R262 Difficulty in walking, not elsewhere classified: Secondary | ICD-10-CM
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
## 2021-05-23 ENCOUNTER — Ambulatory Visit: Payer: No Typology Code available for payment source | Admitting: Rehabilitative and Restorative Service Providers"

## 2021-05-23 ENCOUNTER — Other Ambulatory Visit: Payer: Self-pay

## 2021-05-23 ENCOUNTER — Encounter: Payer: Self-pay | Admitting: Rehabilitative and Restorative Service Providers"

## 2021-05-23 DIAGNOSIS — M545 Low back pain, unspecified: Secondary | ICD-10-CM | POA: Diagnosis not present

## 2021-05-23 DIAGNOSIS — M6281 Muscle weakness (generalized): Secondary | ICD-10-CM

## 2021-05-23 DIAGNOSIS — R2689 Other abnormalities of gait and mobility: Secondary | ICD-10-CM

## 2021-05-23 DIAGNOSIS — R262 Difficulty in walking, not elsewhere classified: Secondary | ICD-10-CM

## 2021-05-23 DIAGNOSIS — M62830 Muscle spasm of back: Secondary | ICD-10-CM

## 2021-05-23 DIAGNOSIS — G8929 Other chronic pain: Secondary | ICD-10-CM

## 2021-05-23 DIAGNOSIS — M542 Cervicalgia: Secondary | ICD-10-CM
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
## 2021-05-26 ENCOUNTER — Encounter: Payer: Self-pay | Admitting: Rehabilitative and Restorative Service Providers"

## 2021-05-26 ENCOUNTER — Ambulatory Visit: Payer: No Typology Code available for payment source | Admitting: Rehabilitative and Restorative Service Providers"

## 2021-05-26 ENCOUNTER — Other Ambulatory Visit: Payer: Self-pay

## 2021-05-26 DIAGNOSIS — M542 Cervicalgia: Secondary | ICD-10-CM

## 2021-05-26 DIAGNOSIS — M6281 Muscle weakness (generalized): Secondary | ICD-10-CM

## 2021-05-26 DIAGNOSIS — R2689 Other abnormalities of gait and mobility: Secondary | ICD-10-CM

## 2021-05-26 DIAGNOSIS — G8929 Other chronic pain: Secondary | ICD-10-CM

## 2021-05-26 DIAGNOSIS — M545 Low back pain, unspecified: Secondary | ICD-10-CM | POA: Diagnosis not present

## 2021-05-26 DIAGNOSIS — M62830 Muscle spasm of back: Secondary | ICD-10-CM

## 2021-05-26 DIAGNOSIS — R262 Difficulty in walking, not elsewhere classified: Secondary | ICD-10-CM
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
## 2021-05-29 ENCOUNTER — Other Ambulatory Visit: Payer: Self-pay

## 2021-05-29 ENCOUNTER — Encounter: Payer: Self-pay | Admitting: Rehabilitative and Restorative Service Providers"

## 2021-05-29 ENCOUNTER — Ambulatory Visit: Payer: No Typology Code available for payment source | Admitting: Rehabilitative and Restorative Service Providers"

## 2021-05-29 DIAGNOSIS — M542 Cervicalgia: Secondary | ICD-10-CM

## 2021-05-29 DIAGNOSIS — R262 Difficulty in walking, not elsewhere classified: Secondary | ICD-10-CM

## 2021-05-29 DIAGNOSIS — M6281 Muscle weakness (generalized): Secondary | ICD-10-CM

## 2021-05-29 DIAGNOSIS — R2689 Other abnormalities of gait and mobility: Secondary | ICD-10-CM

## 2021-05-29 DIAGNOSIS — M62830 Muscle spasm of back: Secondary | ICD-10-CM

## 2021-05-29 DIAGNOSIS — M545 Low back pain, unspecified: Secondary | ICD-10-CM | POA: Diagnosis not present

## 2021-05-29 DIAGNOSIS — G8929 Other chronic pain: Secondary | ICD-10-CM
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
## 2021-06-01 ENCOUNTER — Encounter: Payer: Self-pay | Admitting: Rehabilitative and Restorative Service Providers"

## 2021-06-01 ENCOUNTER — Ambulatory Visit: Payer: No Typology Code available for payment source | Admitting: Rehabilitative and Restorative Service Providers"

## 2021-06-01 ENCOUNTER — Other Ambulatory Visit: Payer: Self-pay

## 2021-06-01 DIAGNOSIS — R2689 Other abnormalities of gait and mobility: Secondary | ICD-10-CM

## 2021-06-01 DIAGNOSIS — M62830 Muscle spasm of back: Secondary | ICD-10-CM

## 2021-06-01 DIAGNOSIS — M545 Low back pain, unspecified: Secondary | ICD-10-CM | POA: Diagnosis not present

## 2021-06-01 DIAGNOSIS — G8929 Other chronic pain: Secondary | ICD-10-CM

## 2021-06-01 DIAGNOSIS — R262 Difficulty in walking, not elsewhere classified: Secondary | ICD-10-CM

## 2021-06-01 DIAGNOSIS — M6281 Muscle weakness (generalized): Secondary | ICD-10-CM

## 2021-06-01 DIAGNOSIS — M542 Cervicalgia: Secondary | ICD-10-CM
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
## 2021-06-12 ENCOUNTER — Ambulatory Visit: Payer: No Typology Code available for payment source | Admitting: Rehabilitative and Restorative Service Providers"

## 2021-06-15 ENCOUNTER — Ambulatory Visit: Payer: No Typology Code available for payment source | Admitting: Rehabilitative and Restorative Service Providers"

## 2021-06-21 ENCOUNTER — Ambulatory Visit: Payer: No Typology Code available for payment source | Admitting: Rehabilitative and Restorative Service Providers"

## 2022-03-06 ENCOUNTER — Encounter: Payer: Self-pay | Admitting: *Deleted

## 2022-03-06 ENCOUNTER — Other Ambulatory Visit: Payer: Self-pay | Admitting: *Deleted

## 2022-03-07 ENCOUNTER — Ambulatory Visit: Payer: Medicare PPO | Admitting: Diagnostic Neuroimaging

## 2022-03-13 ENCOUNTER — Encounter: Payer: Self-pay | Admitting: Diagnostic Neuroimaging

## 2022-03-21 ENCOUNTER — Encounter (HOSPITAL_BASED_OUTPATIENT_CLINIC_OR_DEPARTMENT_OTHER): Payer: Self-pay | Admitting: Emergency Medicine

## 2022-03-21 ENCOUNTER — Emergency Department (HOSPITAL_BASED_OUTPATIENT_CLINIC_OR_DEPARTMENT_OTHER): Payer: No Typology Code available for payment source

## 2022-03-21 ENCOUNTER — Other Ambulatory Visit: Payer: Self-pay

## 2022-03-21 ENCOUNTER — Emergency Department (HOSPITAL_BASED_OUTPATIENT_CLINIC_OR_DEPARTMENT_OTHER)
Admission: EM | Admit: 2022-03-21 | Discharge: 2022-03-21 | Disposition: A | Discharge status: Home / Self Care | Payer: No Typology Code available for payment source | Attending: Emergency Medicine | Admitting: Emergency Medicine

## 2022-03-21 DIAGNOSIS — R000 Tachycardia, unspecified: Secondary | ICD-10-CM | POA: Diagnosis not present

## 2022-03-21 DIAGNOSIS — W19XXXA Unspecified fall, initial encounter: Secondary | ICD-10-CM | POA: Insufficient documentation

## 2022-03-21 DIAGNOSIS — Y92009 Unspecified place in unspecified non-institutional (private) residence as the place of occurrence of the external cause: Secondary | ICD-10-CM | POA: Insufficient documentation

## 2022-03-21 DIAGNOSIS — M542 Cervicalgia: Secondary | ICD-10-CM | POA: Insufficient documentation

## 2022-03-21 DIAGNOSIS — G8929 Other chronic pain: Secondary | ICD-10-CM | POA: Diagnosis not present

## 2022-03-21 DIAGNOSIS — M549 Dorsalgia, unspecified: Secondary | ICD-10-CM | POA: Insufficient documentation

## 2022-03-21 DIAGNOSIS — Z7982 Long term (current) use of aspirin: Secondary | ICD-10-CM | POA: Insufficient documentation

## 2022-03-21 DIAGNOSIS — R531 Weakness: Secondary | ICD-10-CM | POA: Insufficient documentation

## 2022-03-21 LAB — CBC WITH DIFFERENTIAL/PLATELET
Abs Immature Granulocytes: 0.02 10*3/uL (ref 0.00–0.07)
Basophils Absolute: 0.1 10*3/uL (ref 0.0–0.1)
Basophils Relative: 1 %
Eosinophils Absolute: 0.2 10*3/uL (ref 0.0–0.5)
Eosinophils Relative: 3 %
HCT: 43.6 % (ref 39.0–52.0)
Hemoglobin: 14.5 g/dL (ref 13.0–17.0)
Immature Granulocytes: 0 %
Lymphocytes Relative: 16 %
Lymphs Abs: 0.9 10*3/uL (ref 0.7–4.0)
MCH: 31 pg (ref 26.0–34.0)
MCHC: 33.3 g/dL (ref 30.0–36.0)
MCV: 93.2 fL (ref 80.0–100.0)
Monocytes Absolute: 0.4 10*3/uL (ref 0.1–1.0)
Monocytes Relative: 7 %
Neutro Abs: 4 10*3/uL (ref 1.7–7.7)
Neutrophils Relative %: 73 %
Platelets: 234 10*3/uL (ref 150–400)
RBC: 4.68 MIL/uL (ref 4.22–5.81)
RDW: 12.5 % (ref 11.5–15.5)
WBC: 5.6 10*3/uL (ref 4.0–10.5)
nRBC: 0 % (ref 0.0–0.2)

## 2022-03-21 LAB — COMPREHENSIVE METABOLIC PANEL
ALT: 18 U/L (ref 0–44)
AST: 22 U/L (ref 15–41)
Albumin: 4.3 g/dL (ref 3.5–5.0)
Alkaline Phosphatase: 118 U/L (ref 38–126)
Anion gap: 9 (ref 5–15)
BUN: 17 mg/dL (ref 8–23)
CO2: 26 mmol/L (ref 22–32)
Calcium: 10.1 mg/dL (ref 8.9–10.3)
Chloride: 101 mmol/L (ref 98–111)
Creatinine, Ser: 0.95 mg/dL (ref 0.61–1.24)
GFR, Estimated: >60 mL/min (ref >60)
Glucose, Bld: 145 mg/dL — ABNORMAL HIGH (ref 70–99)
Potassium: 3.8 mmol/L (ref 3.5–5.1)
Sodium: 136 mmol/L (ref 135–145)
Total Bilirubin: 0.4 mg/dL (ref 0.3–1.2)
Total Protein: 7.3 g/dL (ref 6.5–8.1)

## 2022-03-21 LAB — URINALYSIS, ROUTINE W REFLEX MICROSCOPIC
Bilirubin Urine: NEGATIVE
Glucose, UA: NEGATIVE mg/dL
Hgb urine dipstick: NEGATIVE
Ketones, ur: NEGATIVE mg/dL
Leukocytes,Ua: NEGATIVE
Nitrite: NEGATIVE
Protein, ur: NEGATIVE mg/dL
Specific Gravity, Urine: >=1.03 (ref 1.005–1.030)
pH: 5 (ref 5.0–8.0)

## 2022-03-21 MED ORDER — SODIUM CHLORIDE 0.9 % IV BOLUS
1000.0000 mL | Freq: Once | INTRAVENOUS | Status: AC
  Administered 2022-03-21: 1000 mL via INTRAVENOUS

## 2022-03-21 NOTE — ED Notes (Signed)
Patient transported to CT 

## 2022-03-21 NOTE — ED Triage Notes (Signed)
Pt via pov from home after multiple falls. [facility] and was told to bring him to ED. Pt uses a walker, but still falls - unclear as to whether or not he is falling with the walker. Pt is weak - wife reports that he "stopped walking a year ago" and then reports that he is able to walk to the toilet and to the bed. Pt alert & oriented, nad noted.  ?

## 2022-03-21 NOTE — Discharge Instructions (Addendum)
Lab work and imaging today is unremarkable.  Please follow-up with your primary care doctor as discussed. ?
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
## 2022-03-21 NOTE — ED Notes (Signed)
Urine sent to lab 

## 2022-04-10 ENCOUNTER — Other Ambulatory Visit: Payer: Self-pay

## 2022-04-10 ENCOUNTER — Telehealth: Payer: Self-pay | Admitting: *Deleted

## 2022-04-10 ENCOUNTER — Emergency Department (HOSPITAL_COMMUNITY): Payer: No Typology Code available for payment source

## 2022-04-10 ENCOUNTER — Emergency Department (HOSPITAL_COMMUNITY)
Admission: EM | Admit: 2022-04-10 | Discharge: 2022-04-11 | Disposition: A | Discharge status: Home / Self Care | Payer: No Typology Code available for payment source | Attending: Emergency Medicine | Admitting: Emergency Medicine

## 2022-04-10 ENCOUNTER — Encounter (HOSPITAL_COMMUNITY): Payer: Self-pay

## 2022-04-10 DIAGNOSIS — M5000 Cervical disc disorder with myelopathy, unspecified cervical region: Secondary | ICD-10-CM | POA: Insufficient documentation

## 2022-04-10 DIAGNOSIS — R7309 Other abnormal glucose: Secondary | ICD-10-CM | POA: Insufficient documentation

## 2022-04-10 DIAGNOSIS — R531 Weakness: Secondary | ICD-10-CM | POA: Insufficient documentation

## 2022-04-10 DIAGNOSIS — M545 Low back pain, unspecified: Secondary | ICD-10-CM | POA: Diagnosis not present

## 2022-04-10 DIAGNOSIS — G959 Disease of spinal cord, unspecified: Secondary | ICD-10-CM

## 2022-04-10 LAB — COMPREHENSIVE METABOLIC PANEL
ALT: 18 U/L (ref 0–44)
AST: 22 U/L (ref 15–41)
Albumin: 4 g/dL (ref 3.5–5.0)
Alkaline Phosphatase: 103 U/L (ref 38–126)
Anion gap: 7 (ref 5–15)
BUN: 13 mg/dL (ref 8–23)
CO2: 26 mmol/L (ref 22–32)
Calcium: 10.4 mg/dL — ABNORMAL HIGH (ref 8.9–10.3)
Chloride: 106 mmol/L (ref 98–111)
Creatinine, Ser: 0.95 mg/dL (ref 0.61–1.24)
GFR, Estimated: >60 mL/min (ref >60)
Glucose, Bld: 107 mg/dL — ABNORMAL HIGH (ref 70–99)
Potassium: 4.5 mmol/L (ref 3.5–5.1)
Sodium: 139 mmol/L (ref 135–145)
Total Bilirubin: 0.5 mg/dL (ref 0.3–1.2)
Total Protein: 6.5 g/dL (ref 6.5–8.1)

## 2022-04-10 LAB — CBC WITH DIFFERENTIAL/PLATELET
Abs Immature Granulocytes: 0.02 10*3/uL (ref 0.00–0.07)
Basophils Absolute: 0.1 10*3/uL (ref 0.0–0.1)
Basophils Relative: 1 %
Eosinophils Absolute: 0.2 10*3/uL (ref 0.0–0.5)
Eosinophils Relative: 4 %
HCT: 43.3 % (ref 39.0–52.0)
Hemoglobin: 13.9 g/dL (ref 13.0–17.0)
Immature Granulocytes: 0 %
Lymphocytes Relative: 21 %
Lymphs Abs: 1.3 10*3/uL (ref 0.7–4.0)
MCH: 30.8 pg (ref 26.0–34.0)
MCHC: 32.1 g/dL (ref 30.0–36.0)
MCV: 95.8 fL (ref 80.0–100.0)
Monocytes Absolute: 0.5 10*3/uL (ref 0.1–1.0)
Monocytes Relative: 9 %
Neutro Abs: 3.9 10*3/uL (ref 1.7–7.7)
Neutrophils Relative %: 65 %
Platelets: 211 10*3/uL (ref 150–400)
RBC: 4.52 MIL/uL (ref 4.22–5.81)
RDW: 12.9 % (ref 11.5–15.5)
WBC: 6 10*3/uL (ref 4.0–10.5)
nRBC: 0 % (ref 0.0–0.2)

## 2022-04-10 LAB — CBG MONITORING, ED: Glucose-Capillary: 102 mg/dL — ABNORMAL HIGH (ref 70–99)

## 2022-04-10 MED ORDER — DEXAMETHASONE SODIUM PHOSPHATE 10 MG/ML IJ SOLN
8.0000 mg | Freq: Once | INTRAMUSCULAR | Status: AC
  Administered 2022-04-10: 8 mg via INTRAVENOUS
  Filled 2022-04-10: qty 1

## 2022-04-10 MED ORDER — LORAZEPAM 1 MG PO TABS
1.0000 mg | ORAL_TABLET | ORAL | Status: DC | PRN
Start: 2022-04-10 — End: 2022-04-11

## 2022-04-10 MED ORDER — METHYLPREDNISOLONE 4 MG PO TBPK: ORAL_TABLET | ORAL | 0 refills | Status: DC

## 2022-04-10 MED ORDER — GADOBUTROL 1 MMOL/ML IV SOLN
10.0000 mL | Freq: Once | INTRAVENOUS | Status: AC | PRN
  Administered 2022-04-10: 10 mL via INTRAVENOUS

## 2022-04-10 MED ORDER — LORAZEPAM 2 MG/ML IJ SOLN
1.0000 mg | INTRAMUSCULAR | Status: AC | PRN
  Administered 2022-04-10: 1 mg via INTRAVENOUS
  Filled 2022-04-10: qty 1

## 2022-04-10 MED ORDER — LACTATED RINGERS IV BOLUS
1000.0000 mL | Freq: Once | INTRAVENOUS | Status: DC
Start: 2022-04-10 — End: 2022-04-10

## 2022-04-10 NOTE — ED Notes (Signed)
Ptar called 

## 2022-04-10 NOTE — ED Triage Notes (Signed)
Pt has degenerative muscular disease and has had multiple falls and increased weakness. Was seen at [facility] and was hypotensive but with ems was 114/78. A&OX4. Denies pain or discomfort

## 2022-04-10 NOTE — Telephone Encounter (Signed)
Received call from Dr Joseph Art, [facility]. He stated patient is rapidly declining, unable to walk and now having difficulty swallowing.  Patient has appointment with Dr Marjory Lies on 04/23/22. I advised we can work patient in this week; perhaps he needs to go to hospital for expedited evaluation, testing. Dr Joseph Art stated patient is being seen today, and he will discuss with him and family about going to [facility] hospital.

## 2022-04-10 NOTE — ED Provider Notes (Signed)
[facility] EMERGENCY DEPARTMENT Provider Note   CSN: [id number] Arrival date & time: 04/10/22  1543     History  Chief Complaint  Patient presents with   Weakness    Rickey Rivera is a 71 y.o. male.   Weakness  71 year old male presenting to the emergency department with a chief complaint of progressive weakness, debilitation, difficulty with ambulation, difficulty with swallowing.  The patient states that he is now having episodes of fecal and urinary incontinence.  He has had difficulty ambulating for the past 2 years.  He has chronic back pain in his lower lumbar spine in addition has had spinal surgery in his cervical spine.  He follows outpatient with [facility].  He has had progressively worsening symptoms over the last few months and was sent to the emergency department from the [place] given worsening debilitation and inability to ambulate.  He has had worsening difficulty with bilateral lower extremity weakness and also endorses upper extremity weakness.  He denies any recent falls or trauma.  Recent outpatient notes:  Dec-19-2022 back pain Rickey Rivera returns to clinic for follow-up. He reports his back pain is well controlled. He has no neck pain. Overall, he feels his neurologic symptoms are stable, though he mentions that when he saw his neurologist Dr. Noel Gerold most recently, he was told his legs have gotten weaker.  Sep-26-2022 back pain Rickey Rivera returns to clinic for follow-up. Unfortunately, he did not get any benefit from his facet injections regarding his low back pain. He still is quite weak in his legs and his hands, and his wife thinks it has been slowly worsening. He does not have any significant neck pain. 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 He has mild cognitive difficulties with inattention  and poor short-term memory. 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    Home Medications Prior to Admission medications   Medication Sig Start Date End Date Taking? Authorizing Provider  methylPREDNISolone (MEDROL DOSEPAK) 4 MG TBPK tablet Take per packet instructions 04/10/22  Yes Ernie Avena, MD  aspirin 81 MG EC tablet     [provider]  diclofenac (VOLTAREN) 75 MG EC tablet Take 75 mg by mouth See admin instructions. One to two times a day as needed for pain    [provider]  donepezil (ARICEPT) 10 MG tablet Take 10 mg by mouth at bedtime.    [provider]  gabapentin (NEURONTIN) 300 MG capsule Take 300-900 mg by mouth See admin instructions. 300 mg in the morning and 900 mg at bedtime (may also take an additional 300 mg during the day, if needed for anxiety and/or headaches)    [provider]  levothyroxine (SYNTHROID, LEVOTHROID) 100 MCG tablet Take 100 mcg by mouth daily before breakfast.    [provider]  melatonin 3 MG TABS tablet Take by mouth. 09/12/20   [provider]  METAMUCIL FIBER PO TAKE 1 TABLESPOONFUL BY MOUTH TWICE A DAY (MIX IN 8 OUNCES OF WATER OR JUICE AND DRINK) 05/30/21   [provider]  metoprolol tartrate (LOPRESSOR) 50 MG tablet Take by mouth. 11/07/20   [provider]  naproxen (NAPROSYN) 500 MG tablet as needed. 05/22/21   [provider]  omeprazole (PRILOSEC) 40 MG capsule Take by mouth. 06/14/21  [provider]  Oxcarbazepine (TRILEPTAL) 300 MG tablet Take 450 mg by mouth 3 (three) times daily. 05/22/21   [provider]  SUMAtriptan (IMITREX) 100 MG tablet Take 100 mg by mouth once as needed for migraine or headache (may repeat once in 2 hours (if no relief)). May repeat in 2 hours if headache persists or recurs.    [provider]  Testosterone 20.25 MG/ACT (1.62%) GEL Place onto the skin. 10/05/20    [provider]  traZODone (DESYREL) 100 MG tablet Take by mouth. 03/28/21   [provider]  venlafaxine XR (EFFEXOR-XR) 75 MG 24 hr capsule Take 75 mg by mouth daily with breakfast.    [provider]      Allergies    Bupropion and Citalopram    Review of Systems   Review of Systems  Genitourinary:  Positive for difficulty urinating.  Neurological:  Positive for weakness. Negative for facial asymmetry and speech difficulty.  All other systems reviewed and are negative.  Physical Exam Updated Vital Signs BP 101/88   Pulse 97   Temp 98.8 F (37.1 C) (Oral)   Resp 11   Ht  (1.727 m)   Wt 122.5 kg   SpO2 95%   BMI 41.05 kg/m  Physical Exam Vitals and nursing note reviewed.  Constitutional:      General: He is not in acute distress.    Appearance: He is well-developed.  HENT:     Head: Normocephalic and atraumatic.  Eyes:     Conjunctiva/sclera: Conjunctivae normal.  Cardiovascular:     Rate and Rhythm: Normal rate and regular rhythm.  Pulmonary:     Effort: Pulmonary effort is normal. No respiratory distress.     Breath sounds: Normal breath sounds.  Abdominal:     Palpations: Abdomen is soft.     Tenderness: There is no abdominal tenderness.  Musculoskeletal:        General: No swelling.     Cervical back: Neck supple.  Skin:    General: Skin is warm and dry.     Capillary Refill: Capillary refill takes less than 2 seconds.  Neurological:     Mental Status: He is alert.     Comments: MENTAL STATUS EXAM:    Orientation: Alert and oriented to person, place and time.  Memory: Cooperative, follows commands well.  Language: Speech is clear and language is normal.   CRANIAL NERVES:    CN 2 (Optic): Visual fields intact to confrontation.  CN 3,4,6 (EOM): Pupils equal and reactive to light. Full extraocular eye movement without nystagmus.  CN 5 (Trigeminal): Facial sensation is normal, no weakness of masticatory muscles.  CN 7  (Facial): No facial weakness or asymmetry.  CN 8 (Auditory): Auditory acuity grossly normal.  CN 9,10 (Glossophar): The uvula is midline, the palate elevates symmetrically.  CN 11 (spinal access): Normal sternocleidomastoid and trapezius strength.  CN 12 (Hypoglossal): The tongue is midline. No atrophy or fasciculations.[name]   MOTOR:  Muscle Strength: 4/5RUE, 4/5LUE, 3/5RLE, 3/5LLE  REFLEXES: 2+ patellar reflexes.   COORDINATION:   No tremor.   SENSATION:   Intact to light touch all four extremities.  GAIT: Unable to assess   Psychiatric:        Mood and Affect: Mood normal.    ED Results / Procedures / Treatments   Labs (all labs ordered are listed, but only abnormal results are displayed) Labs Reviewed  COMPREHENSIVE METABOLIC PANEL - Abnormal; Notable for the following components:  Result Value   Glucose, Bld 107 (*)    Calcium 10.4 (*)    All other components within normal limits  CBG MONITORING, ED - Abnormal; Notable for the following components:   Glucose-Capillary 102 (*)    All other components within normal limits  CBC WITH DIFFERENTIAL/PLATELET  URINALYSIS, ROUTINE W REFLEX MICROSCOPIC    EKG EKG Interpretation  Date/Time:  Tuesday April 10 2022 15:55:10 EDT Ventricular Rate:  82 PR Interval:  200 QRS Duration: 100 QT Interval:  376 QTC Calculation: 440 R Axis:   24 Text Interpretation: Sinus rhythm Low voltage, precordial leads Nonspecific T wave abnormality Confirmed by Ernie Avena ([id number]) on 04/10/2022 5:56:37 PM  Radiology MR Brain W and Wo Contrast  Result Date: 04/10/2022 CLINICAL DATA:  Acute neurologic deficit.  Weakness and dysphagia. EXAM: MRI HEAD WITHOUT AND WITH CONTRAST TECHNIQUE: Multiplanar, multiecho pulse sequences of the brain and surrounding structures were obtained without and with intravenous contrast. CONTRAST:  63mL GADAVIST GADOBUTROL 1 MMOL/ML IV SOLN COMPARISON:  MRI 06/20/2009, head CT 03/21/2022 FINDINGS: Brain: No acute  infarct, mass effect or extra-axial collection. No acute or chronic hemorrhage. There is multifocal hyperintense T2-weighted signal within the white matter. Generalized cerebral volume loss. The midline structures are normal. There is no abnormal contrast enhancement. Vascular: Major flow voids are preserved. Skull and upper cervical spine: Normal calvarium and skull base. Visualized upper cervical spine and soft tissues are normal. Sinuses/Orbits:No paranasal sinus fluid levels or advanced mucosal thickening. No mastoid or middle ear effusion. Normal orbits. IMPRESSION: 1. No acute intracranial abnormality. 2. Findings of chronic microvascular ischemia and generalized cerebral volume loss. Electronically Signed   By: Deatra Robinson M.D.   On: 04/10/2022 21:55   MR Cervical Spine W or Wo Contrast  Result Date: 04/10/2022 CLINICAL DATA:  Acute cervical myelopathy. EXAM: MRI CERVICAL, THORACIC AND LUMBAR SPINE WITHOUT AND WITH CONTRAST TECHNIQUE: Multiplanar and multiecho pulse sequences of the cervical spine, to include the craniocervical junction and cervicothoracic junction, and thoracic and lumbar spine, were obtained without and with intravenous contrast. CONTRAST:  48mL GADAVIST GADOBUTROL 1 MMOL/ML IV SOLN COMPARISON:  None Available. FINDINGS: MRI CERVICAL SPINE FINDINGS Alignment: Physiologic. Vertebrae: C5-6 ACDF Cord: Cord is atrophic at the C5-6 level. Posterior Fossa, vertebral arteries, paraspinal tissues: Small prevertebral effusion Disc levels: C1-2: Unremarkable. C2-3: Mild bilateral uncovertebral spurring. There is no spinal canal stenosis. No neural foraminal stenosis. C3-4: Small central disc protrusion. There is no spinal canal stenosis. No neural foraminal stenosis. C4-5: Medium-sized disc bulge with uncovertebral spurring. Mild spinal canal stenosis. Severe bilateral neural foraminal stenosis. C5-6: ACDF. Moderate spinal canal stenosis. Severe bilateral neural foraminal stenosis. C6-7: Normal  disc space and facet joints. There is no spinal canal stenosis. No neural foraminal stenosis. C7-T1: Normal disc space and facet joints. There is no spinal canal stenosis. No neural foraminal stenosis. MRI THORACIC SPINE FINDINGS Alignment:  Physiologic. Vertebrae: No fracture, evidence of discitis, or bone lesion. Cord:  Normal signal and morphology. Paraspinal and other soft tissues: Negative. Disc levels: There are small central disc protrusions at T6-7, T7-8, T8-9 and T9-10. The T10-11, there is a medium-size left subarticular protrusion causing mild spinal canal stenosis. MRI LUMBAR SPINE FINDINGS Segmentation:  Standard. Alignment:  Physiologic. Vertebrae:  No fracture, evidence of discitis, or bone lesion. Conus medullaris and cauda equina: Conus extends to the L1 level. Conus and cauda equina appear normal. Paraspinal and other soft tissues: Negative. Disc levels: L1-L2: Normal disc space and facet joints. No spinal  canal stenosis. No neural foraminal stenosis. L2-L3: Normal disc space and facet joints. No spinal canal stenosis. No neural foraminal stenosis. L3-L4: Medium-sized disc bulge with right facet hypertrophy. Right lateral recess narrowing without central spinal canal stenosis. No neural foraminal stenosis. L4-L5: Medium sized disc bulge with bilateral facet hypertrophy. No spinal canal stenosis. No neural foraminal stenosis. L5-S1: Small disc bulge. No spinal canal stenosis. No neural foraminal stenosis. Visualized sacrum: Normal. IMPRESSION: 1. No acute abnormality of the cervical, thoracic or lumbar spine. 2. C5-6 ACDF with moderate spinal canal stenosis and severe bilateral neural foraminal stenosis. 3. Lumbar degenerative disc and facet disease without spinal canal stenosis 4. Mild thoracic degenerative disc disease with mild spinal canal stenosis at T10-11. Electronically Signed   By: Deatra RobinsonKevin  Herman M.D.   On: 04/10/2022 22:13   MR THORACIC SPINE W WO CONTRAST  Result Date:  04/10/2022 CLINICAL DATA:  Acute cervical myelopathy. EXAM: MRI CERVICAL, THORACIC AND LUMBAR SPINE WITHOUT AND WITH CONTRAST TECHNIQUE: Multiplanar and multiecho pulse sequences of the cervical spine, to include the craniocervical junction and cervicothoracic junction, and thoracic and lumbar spine, were obtained without and with intravenous contrast. CONTRAST:  10mL GADAVIST GADOBUTROL 1 MMOL/ML IV SOLN COMPARISON:  None Available. FINDINGS: MRI CERVICAL SPINE FINDINGS Alignment: Physiologic. Vertebrae: C5-6 ACDF Cord: Cord is atrophic at the C5-6 level. Posterior Fossa, vertebral arteries, paraspinal tissues: Small prevertebral effusion Disc levels: C1-2: Unremarkable. C2-3: Mild bilateral uncovertebral spurring. There is no spinal canal stenosis. No neural foraminal stenosis. C3-4: Small central disc protrusion. There is no spinal canal stenosis. No neural foraminal stenosis. C4-5: Medium-sized disc bulge with uncovertebral spurring. Mild spinal canal stenosis. Severe bilateral neural foraminal stenosis. C5-6: ACDF. Moderate spinal canal stenosis. Severe bilateral neural foraminal stenosis. C6-7: Normal disc space and facet joints. There is no spinal canal stenosis. No neural foraminal stenosis. C7-T1: Normal disc space and facet joints. There is no spinal canal stenosis. No neural foraminal stenosis. MRI THORACIC SPINE FINDINGS Alignment:  Physiologic. Vertebrae: No fracture, evidence of discitis, or bone lesion. Cord:  Normal signal and morphology. Paraspinal and other soft tissues: Negative. Disc levels: There are small central disc protrusions at T6-7, T7-8, T8-9 and T9-10. The T10-11, there is a medium-size left subarticular protrusion causing mild spinal canal stenosis. MRI LUMBAR SPINE FINDINGS Segmentation:  Standard. Alignment:  Physiologic. Vertebrae:  No fracture, evidence of discitis, or bone lesion. Conus medullaris and cauda equina: Conus extends to the L1 level. Conus and cauda equina appear  normal. Paraspinal and other soft tissues: Negative. Disc levels: L1-L2: Normal disc space and facet joints. No spinal canal stenosis. No neural foraminal stenosis. L2-L3: Normal disc space and facet joints. No spinal canal stenosis. No neural foraminal stenosis. L3-L4: Medium-sized disc bulge with right facet hypertrophy. Right lateral recess narrowing without central spinal canal stenosis. No neural foraminal stenosis. L4-L5: Medium sized disc bulge with bilateral facet hypertrophy. No spinal canal stenosis. No neural foraminal stenosis. L5-S1: Small disc bulge. No spinal canal stenosis. No neural foraminal stenosis. Visualized sacrum: Normal. IMPRESSION: 1. No acute abnormality of the cervical, thoracic or lumbar spine. 2. C5-6 ACDF with moderate spinal canal stenosis and severe bilateral neural foraminal stenosis. 3. Lumbar degenerative disc and facet disease without spinal canal stenosis 4. Mild thoracic degenerative disc disease with mild spinal canal stenosis at T10-11. Electronically Signed   By: Deatra RobinsonKevin  Herman M.D.   On: 04/10/2022 22:13   MR Lumbar Spine W Wo Contrast  Result Date: 04/10/2022 CLINICAL DATA:  Acute cervical myelopathy.  EXAM: MRI CERVICAL, THORACIC AND LUMBAR SPINE WITHOUT AND WITH CONTRAST TECHNIQUE: Multiplanar and multiecho pulse sequences of the cervical spine, to include the craniocervical junction and cervicothoracic junction, and thoracic and lumbar spine, were obtained without and with intravenous contrast. CONTRAST:  10mL GADAVIST GADOBUTROL 1 MMOL/ML IV SOLN COMPARISON:  None Available. FINDINGS: MRI CERVICAL SPINE FINDINGS Alignment: Physiologic. Vertebrae: C5-6 ACDF Cord: Cord is atrophic at the C5-6 level. Posterior Fossa, vertebral arteries, paraspinal tissues: Small prevertebral effusion Disc levels: C1-2: Unremarkable. C2-3: Mild bilateral uncovertebral spurring. There is no spinal canal stenosis. No neural foraminal stenosis. C3-4: Small central disc protrusion. There is  no spinal canal stenosis. No neural foraminal stenosis. C4-5: Medium-sized disc bulge with uncovertebral spurring. Mild spinal canal stenosis. Severe bilateral neural foraminal stenosis. C5-6: ACDF. Moderate spinal canal stenosis. Severe bilateral neural foraminal stenosis. C6-7: Normal disc space and facet joints. There is no spinal canal stenosis. No neural foraminal stenosis. C7-T1: Normal disc space and facet joints. There is no spinal canal stenosis. No neural foraminal stenosis. MRI THORACIC SPINE FINDINGS Alignment:  Physiologic. Vertebrae: No fracture, evidence of discitis, or bone lesion. Cord:  Normal signal and morphology. Paraspinal and other soft tissues: Negative. Disc levels: There are small central disc protrusions at T6-7, T7-8, T8-9 and T9-10. The T10-11, there is a medium-size left subarticular protrusion causing mild spinal canal stenosis. MRI LUMBAR SPINE FINDINGS Segmentation:  Standard. Alignment:  Physiologic. Vertebrae:  No fracture, evidence of discitis, or bone lesion. Conus medullaris and cauda equina: Conus extends to the L1 level. Conus and cauda equina appear normal. Paraspinal and other soft tissues: Negative. Disc levels: L1-L2: Normal disc space and facet joints. No spinal canal stenosis. No neural foraminal stenosis. L2-L3: Normal disc space and facet joints. No spinal canal stenosis. No neural foraminal stenosis. L3-L4: Medium-sized disc bulge with right facet hypertrophy. Right lateral recess narrowing without central spinal canal stenosis. No neural foraminal stenosis. L4-L5: Medium sized disc bulge with bilateral facet hypertrophy. No spinal canal stenosis. No neural foraminal stenosis. L5-S1: Small disc bulge. No spinal canal stenosis. No neural foraminal stenosis. Visualized sacrum: Normal. IMPRESSION: 1. No acute abnormality of the cervical, thoracic or lumbar spine. 2. C5-6 ACDF with moderate spinal canal stenosis and severe bilateral neural foraminal stenosis. 3. Lumbar  degenerative disc and facet disease without spinal canal stenosis 4. Mild thoracic degenerative disc disease with mild spinal canal stenosis at T10-11. Electronically Signed   By: Deatra Robinson M.D.   On: 04/10/2022 22:13    Procedures Procedures    Medications Ordered in ED Medications  LORazepam (ATIVAN) tablet 1 mg (has no administration in time range)  dexamethasone (DECADRON) injection 8 mg (has no administration in time range)  LORazepam (ATIVAN) injection 1 mg (1 mg Intravenous Given 04/10/22 1903)  gadobutrol (GADAVIST) 1 MMOL/ML injection 10 mL (10 mLs Intravenous Contrast Given 04/10/22 2108)    ED Course/ Medical Decision Making/ A&P                           Medical Decision Making Amount and/or Complexity of Data Reviewed Labs: ordered. Radiology: ordered.  Risk Prescription drug management.   71 year old male presenting to the emergency department with a chief complaint of progressive weakness, debilitation, difficulty with ambulation, difficulty with swallowing.  The patient states that he is now having episodes of fecal and urinary incontinence.  He has had difficulty ambulating for the past 2 years.  He has chronic back pain in his  lower lumbar spine in addition has had spinal surgery in his cervical spine.  He follows outpatient with [facility].  He has had progressively worsening symptoms over the last few months and was sent to the emergency department from the [place] given worsening debilitation and inability to ambulate.  He has had worsening difficulty with bilateral lower extremity weakness and also endorses upper extremity weakness.  He denies any recent falls or trauma.  On arrival, the patient was vitally stable.  He is presenting with progressively worsening functional decline in the setting of known prior difficulties with compressive myelopathy requiring surgery.  Lower suspicion for acute CVA.  Given the patient's new urinary and fecal  incontinence, progressively worsening bilateral lower extremity weakness with some associated bilateral upper extremity weakness, new dysphagia, will obtain MRI of the brain and spine to evaluate for a compressive myelopathy.  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  Additionally considered motor neuron disease although given the patient's extensive history of spinal disease can feel that this is less likely.  I spoke with Dr. Amada Jupiter of on-call neurology who agreed with the plan for MRI imaging.  EMG imaging unfortunately is not available in the hospital inpatient and would need to be done in an outpatient setting.  Laboratory evaluation significant for CMP without significant electrolyte abnormality, mild hypercalcemia to 10.4, CBC without a leukocytosis or anemia, CBG normal.  MRI Brain WWO: IMPRESSION:  1. No acute intracranial abnormality.  2. Findings of chronic microvascular ischemia and generalized  cerebral volume loss.      MRI Spine WWO: IMPRESSION:  1. No acute abnormality of the cervical, thoracic or lumbar spine.  2. C5-6 ACDF with moderate spinal canal stenosis and severe  bilateral neural foraminal stenosis.  3. Lumbar degenerative disc and facet disease without spinal canal  stenosis  4. Mild thoracic degenerative disc disease with mild spinal canal  stenosis at T10-11.   Discussed findings with Dr. Amada Jupiter of neurology who recommended neurosurgery consultation. Patient can also follow-up outpatient with neurology for EMG testing. 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  Primarily acuity is new findings of urge urinary incontinence per the patient's wife.  The patient has had progressive myelopathy symptoms and now is exhibiting dysphagia which could also be due to cervical myelopathy.  On-call neurosurgery consulted for further  recommendations.  Discussed the case with Dr. Julien Girt of on-call neurosurgery who reviewed the patient's MRI imaging.  No acute indication for neurosurgical intervention at this time.  Patient can follow-up outpatient with neurosurgery in clinic to discuss further management.  Did recommend starting the patient on a steroid course.  Medrol Dosepak ordered.  Discussed findings with the patient.  Patient appears to have 5 out of 5 strength in the upper extremities on repeat assessment, still with persistent 3 out of 5 strength in the lower extremities which has been chronic over the past 2 months.  Plan for discharge with outpatient neurology and neurosurgery follow-up.    Final Clinical Impression(s) / ED Diagnoses Final diagnoses:  Cervical myelopathy (HCC)  Weakness    Rx / DC Orders ED Discharge Orders          Ordered    methylPREDNISolone (MEDROL DOSEPAK) 4 MG TBPK tablet        04/10/22 2301    Ambulatory referral to Neurology  Status:  Canceled  Comments: An appointment is requested in approximately: 2 weeks   04/10/22 2302    Ambulatory referral to Neurology       Comments: An appointment is requested in approximately: 2 weeks   04/10/22 2303              Ernie Avena, MD 04/10/22 2306

## 2022-04-10 NOTE — ED Notes (Addendum)
Pateint's PCP called for an update regarding patient's status. This RN updated him with information that is available at this time.

## 2022-04-10 NOTE — Discharge Instructions (Addendum)
Your MRI imaging was reviewed with both neurology and on-call neurosurgery.  Full results are as follows: MRI Brain WWO: IMPRESSION:  1. No acute intracranial abnormality.  2. Findings of chronic microvascular ischemia and generalized  cerebral volume loss.       MRI Spine WWO: IMPRESSION:  1. No acute abnormality of the cervical, thoracic or lumbar spine.  2. C5-6 ACDF with moderate spinal canal stenosis and severe  bilateral neural foraminal stenosis.  3. Lumbar degenerative disc and facet disease without spinal canal  stenosis  4. Mild thoracic degenerative disc disease with mild spinal canal  stenosis at T10-11.     Given these findings, upon review by both neurosurgery and neurology, recommend outpatient follow-up with both specialties. Neurology can perform EMG testing outpatient. Neurosurgery will continue to monitor your symptoms. Per neurosurgery recommendations, we will discharge you on a steroid dose pack.

## 2022-04-11 ENCOUNTER — Emergency Department (HOSPITAL_COMMUNITY)
Admission: EM | Admit: 2022-04-11 | Discharge: 2022-04-11 | Disposition: A | Discharge status: Home / Self Care | Payer: No Typology Code available for payment source

## 2022-04-11 NOTE — ED Notes (Signed)
Pt returned due to wife not being home. [facility] returned pt.

## 2022-04-19 ENCOUNTER — Encounter: Payer: Self-pay | Admitting: *Deleted

## 2022-04-23 ENCOUNTER — Encounter: Payer: Self-pay | Admitting: Diagnostic Neuroimaging

## 2022-04-23 ENCOUNTER — Ambulatory Visit (INDEPENDENT_AMBULATORY_CARE_PROVIDER_SITE_OTHER): Payer: No Typology Code available for payment source | Admitting: Diagnostic Neuroimaging

## 2022-04-23 VITALS — BP 115/76 | HR 101

## 2022-04-23 DIAGNOSIS — R531 Weakness: Secondary | ICD-10-CM
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
## 2022-04-23 NOTE — Patient Instructions (Signed)
  GAIT DIFF / LOWER EXTREMITY WEAKNESS (now mainly hip flexion weakness) - check CK, aldolase - recommend PT evalution - request records from [facility]

## 2022-04-24 LAB — CK: Total CK: 46 U/L (ref 41–331)

## 2022-04-24 LAB — ALDOLASE: Aldolase: 3 U/L — ABNORMAL LOW (ref 3.3–10.3)

## 2022-04-27 ENCOUNTER — Telehealth: Payer: Self-pay

## 2022-04-27 NOTE — Telephone Encounter (Signed)
I called pt and spoke with his wife [name], ok per dpr) and updated on labs.   She verbalized understanding/appreciation for the call and will relay to pt.

## 2022-07-04 ENCOUNTER — Telehealth: Payer: Self-pay | Admitting: *Deleted

## 2022-07-04 NOTE — Telephone Encounter (Signed)
Received request from patient for Dr Marjory Lies to complete a [employer] letter. Letter on MD desk for completion.

## 2022-07-05 ENCOUNTER — Encounter: Payer: Self-pay | Admitting: *Deleted

## 2022-07-05 NOTE — Telephone Encounter (Signed)
[facility] letter completed, signed by Dr Marjory Lies. Sent to medical records for processing.

## 2022-07-10 ENCOUNTER — Other Ambulatory Visit: Payer: Self-pay

## 2022-07-10 ENCOUNTER — Emergency Department (HOSPITAL_COMMUNITY): Payer: No Typology Code available for payment source

## 2022-07-10 ENCOUNTER — Inpatient Hospital Stay (HOSPITAL_COMMUNITY)
Admission: EM | Admit: 2022-07-10 | Discharge: 2022-07-12 | DRG: 871 | Disposition: A | Discharge status: Home Health | Payer: No Typology Code available for payment source | Attending: Family Medicine | Admitting: Family Medicine

## 2022-07-10 ENCOUNTER — Encounter (HOSPITAL_COMMUNITY): Payer: Self-pay

## 2022-07-10 DIAGNOSIS — R413 Other amnesia: Secondary | ICD-10-CM

## 2022-07-10 DIAGNOSIS — A4151 Sepsis due to Escherichia coli [E. coli]: Principal | ICD-10-CM | POA: Diagnosis present

## 2022-07-10 DIAGNOSIS — Z6841 Body Mass Index (BMI) 40.0 and over, adult: Secondary | ICD-10-CM | POA: Diagnosis not present

## 2022-07-10 DIAGNOSIS — K219 Gastro-esophageal reflux disease without esophagitis: Secondary | ICD-10-CM | POA: Diagnosis present

## 2022-07-10 DIAGNOSIS — E669 Obesity, unspecified: Secondary | ICD-10-CM | POA: Diagnosis present

## 2022-07-10 DIAGNOSIS — R6520 Severe sepsis without septic shock: Secondary | ICD-10-CM | POA: Diagnosis present

## 2022-07-10 DIAGNOSIS — Z79899 Other long term (current) drug therapy: Secondary | ICD-10-CM

## 2022-07-10 DIAGNOSIS — Z7982 Long term (current) use of aspirin: Secondary | ICD-10-CM

## 2022-07-10 DIAGNOSIS — Y92009 Unspecified place in unspecified non-institutional (private) residence as the place of occurrence of the external cause: Secondary | ICD-10-CM

## 2022-07-10 DIAGNOSIS — E039 Hypothyroidism, unspecified: Secondary | ICD-10-CM | POA: Diagnosis present

## 2022-07-10 DIAGNOSIS — F32A Depression, unspecified: Secondary | ICD-10-CM | POA: Diagnosis present

## 2022-07-10 DIAGNOSIS — G9341 Metabolic encephalopathy: Secondary | ICD-10-CM

## 2022-07-10 DIAGNOSIS — Z981 Arthrodesis status: Secondary | ICD-10-CM

## 2022-07-10 DIAGNOSIS — A419 Sepsis, unspecified organism: Secondary | ICD-10-CM | POA: Diagnosis present

## 2022-07-10 DIAGNOSIS — N3001 Acute cystitis with hematuria: Secondary | ICD-10-CM | POA: Diagnosis present

## 2022-07-10 DIAGNOSIS — R7303 Prediabetes: Secondary | ICD-10-CM | POA: Diagnosis present

## 2022-07-10 DIAGNOSIS — Z8042 Family history of malignant neoplasm of prostate: Secondary | ICD-10-CM

## 2022-07-10 DIAGNOSIS — Z8249 Family history of ischemic heart disease and other diseases of the circulatory system: Secondary | ICD-10-CM | POA: Diagnosis not present

## 2022-07-10 DIAGNOSIS — Z87820 Personal history of traumatic brain injury: Secondary | ICD-10-CM

## 2022-07-10 DIAGNOSIS — E785 Hyperlipidemia, unspecified: Secondary | ICD-10-CM | POA: Diagnosis present

## 2022-07-10 DIAGNOSIS — G629 Polyneuropathy, unspecified: Secondary | ICD-10-CM

## 2022-07-10 DIAGNOSIS — I480 Paroxysmal atrial fibrillation: Secondary | ICD-10-CM | POA: Diagnosis present

## 2022-07-10 DIAGNOSIS — Z79890 Hormone replacement therapy (postmenopausal): Secondary | ICD-10-CM

## 2022-07-10 DIAGNOSIS — W19XXXA Unspecified fall, initial encounter: Secondary | ICD-10-CM | POA: Diagnosis present

## 2022-07-10 DIAGNOSIS — G4733 Obstructive sleep apnea (adult) (pediatric): Secondary | ICD-10-CM | POA: Diagnosis present

## 2022-07-10 DIAGNOSIS — N390 Urinary tract infection, site not specified: Secondary | ICD-10-CM

## 2022-07-10 DIAGNOSIS — R531 Weakness: Secondary | ICD-10-CM

## 2022-07-10 LAB — URINALYSIS, ROUTINE W REFLEX MICROSCOPIC
Bacteria, UA: NONE SEEN
Glucose, UA: NEGATIVE mg/dL
Ketones, ur: 5 mg/dL — AB
Nitrite: NEGATIVE
Protein, ur: >=300 mg/dL — AB
RBC / HPF: >50 RBC/hpf — ABNORMAL HIGH (ref 0–5)
Specific Gravity, Urine: >1.046 — ABNORMAL HIGH (ref 1.005–1.030)
WBC, UA: >50 WBC/hpf — ABNORMAL HIGH (ref 0–5)
pH: 5 (ref 5.0–8.0)

## 2022-07-10 LAB — COMPREHENSIVE METABOLIC PANEL
ALT: 22 U/L (ref 0–44)
AST: 43 U/L — ABNORMAL HIGH (ref 15–41)
Albumin: 4.3 g/dL (ref 3.5–5.0)
Alkaline Phosphatase: 114 U/L (ref 38–126)
Anion gap: 9 (ref 5–15)
BUN: 15 mg/dL (ref 8–23)
CO2: 23 mmol/L (ref 22–32)
Calcium: 10 mg/dL (ref 8.9–10.3)
Chloride: 106 mmol/L (ref 98–111)
Creatinine, Ser: 0.94 mg/dL (ref 0.61–1.24)
GFR, Estimated: >60 mL/min (ref >60)
Glucose, Bld: 155 mg/dL — ABNORMAL HIGH (ref 70–99)
Potassium: 4.8 mmol/L (ref 3.5–5.1)
Sodium: 138 mmol/L (ref 135–145)
Total Bilirubin: 0.4 mg/dL (ref 0.3–1.2)
Total Protein: 7.3 g/dL (ref 6.5–8.1)

## 2022-07-10 LAB — CBC WITH DIFFERENTIAL/PLATELET
Abs Immature Granulocytes: 0.07 10*3/uL (ref 0.00–0.07)
Basophils Absolute: 0.1 10*3/uL (ref 0.0–0.1)
Basophils Relative: 1 %
Eosinophils Absolute: 0.1 10*3/uL (ref 0.0–0.5)
Eosinophils Relative: 0 %
HCT: 40.7 % (ref 39.0–52.0)
Hemoglobin: 13.2 g/dL (ref 13.0–17.0)
Immature Granulocytes: 1 %
Lymphocytes Relative: 4 %
Lymphs Abs: 0.6 10*3/uL — ABNORMAL LOW (ref 0.7–4.0)
MCH: 30.8 pg (ref 26.0–34.0)
MCHC: 32.4 g/dL (ref 30.0–36.0)
MCV: 94.9 fL (ref 80.0–100.0)
Monocytes Absolute: 1 10*3/uL (ref 0.1–1.0)
Monocytes Relative: 7 %
Neutro Abs: 13.3 10*3/uL — ABNORMAL HIGH (ref 1.7–7.7)
Neutrophils Relative %: 87 %
Platelets: 221 10*3/uL (ref 150–400)
RBC: 4.29 MIL/uL (ref 4.22–5.81)
RDW: 12.9 % (ref 11.5–15.5)
WBC: 15 10*3/uL — ABNORMAL HIGH (ref 4.0–10.5)
nRBC: 0 % (ref 0.0–0.2)

## 2022-07-10 LAB — LACTIC ACID, PLASMA
Lactic Acid, Venous: 2.9 mmol/L (ref 0.5–1.9)
Lactic Acid, Venous: 2.8 mmol/L (ref 0.5–1.9)
Lactic Acid, Venous: 2.4 mmol/L (ref 0.5–1.9)
Lactic Acid, Venous: 2.6 mmol/L (ref 0.5–1.9)

## 2022-07-10 LAB — HIV ANTIBODY (ROUTINE TESTING W REFLEX): HIV Screen 4th Generation wRfx: NONREACTIVE

## 2022-07-10 LAB — APTT: aPTT: 23 seconds — ABNORMAL LOW (ref 24–36)

## 2022-07-10 LAB — PROTIME-INR
INR: 1 (ref 0.8–1.2)
Prothrombin Time: 13.3 seconds (ref 11.4–15.2)

## 2022-07-10 MED ORDER — MEMANTINE HCL 10 MG PO TABS
20.0000 mg | ORAL_TABLET | Freq: Every day | ORAL | Status: DC
  Administered 2022-07-10 – 2022-07-11 (×2): 20 mg via ORAL
  Filled 2022-07-10 (×2): qty 2

## 2022-07-10 MED ORDER — GABAPENTIN 300 MG PO CAPS
600.0000 mg | ORAL_CAPSULE | Freq: Every day | ORAL | Status: DC
  Administered 2022-07-10 – 2022-07-11 (×2): 600 mg via ORAL
  Filled 2022-07-10 (×2): qty 2

## 2022-07-10 MED ORDER — MELATONIN 3 MG PO TABS
9.0000 mg | ORAL_TABLET | Freq: Every day | ORAL | Status: DC
  Administered 2022-07-10 – 2022-07-11 (×2): 9 mg via ORAL
  Filled 2022-07-10 (×2): qty 3

## 2022-07-10 MED ORDER — LACTATED RINGERS IV BOLUS (SEPSIS)
1000.0000 mL | Freq: Once | INTRAVENOUS | Status: AC
  Administered 2022-07-10: 1000 mL via INTRAVENOUS

## 2022-07-10 MED ORDER — PANTOPRAZOLE SODIUM 40 MG PO TBEC
80.0000 mg | DELAYED_RELEASE_TABLET | Freq: Every day | ORAL | Status: DC
  Administered 2022-07-10 – 2022-07-12 (×3): 80 mg via ORAL
  Filled 2022-07-10 (×3): qty 2

## 2022-07-10 MED ORDER — LEVOTHYROXINE SODIUM 100 MCG PO TABS
100.0000 ug | ORAL_TABLET | Freq: Every day | ORAL | Status: DC
  Administered 2022-07-11 – 2022-07-12 (×2): 100 ug via ORAL
  Filled 2022-07-10 (×2): qty 1

## 2022-07-10 MED ORDER — VENLAFAXINE HCL ER 75 MG PO CP24
75.0000 mg | ORAL_CAPSULE | Freq: Every day | ORAL | Status: DC
  Administered 2022-07-11 – 2022-07-12 (×2): 75 mg via ORAL
  Filled 2022-07-10 (×2): qty 1

## 2022-07-10 MED ORDER — ASPIRIN 81 MG PO TBEC
81.0000 mg | DELAYED_RELEASE_TABLET | Freq: Every day | ORAL | Status: DC
  Administered 2022-07-10 – 2022-07-12 (×3): 81 mg via ORAL
  Filled 2022-07-10 (×3): qty 1

## 2022-07-10 MED ORDER — DICLOFENAC SODIUM 75 MG PO TBEC: 75.0000 mg | DELAYED_RELEASE_TABLET | Freq: Two times a day (BID) | ORAL | Status: DC | PRN

## 2022-07-10 MED ORDER — INFLUENZA VAC A&B SA ADJ QUAD 0.5 ML IM PRSY
0.5000 mL | PREFILLED_SYRINGE | INTRAMUSCULAR | Status: DC | PRN
  Filled 2022-07-10: qty 0.5

## 2022-07-10 MED ORDER — ACETAMINOPHEN 650 MG RE SUPP: 650.0000 mg | Freq: Four times a day (QID) | RECTAL | Status: DC | PRN

## 2022-07-10 MED ORDER — NAPROXEN 500 MG PO TABS
500.0000 mg | ORAL_TABLET | Freq: Two times a day (BID) | ORAL | Status: DC
  Administered 2022-07-10 – 2022-07-12 (×5): 500 mg via ORAL
  Filled 2022-07-10 (×5): qty 1

## 2022-07-10 MED ORDER — OXCARBAZEPINE 300 MG PO TABS
600.0000 mg | ORAL_TABLET | Freq: Two times a day (BID) | ORAL | Status: DC
  Administered 2022-07-10 – 2022-07-12 (×5): 600 mg via ORAL
  Filled 2022-07-10 (×5): qty 2

## 2022-07-10 MED ORDER — SODIUM CHLORIDE 0.9 % IV SOLN
2.0000 g | INTRAVENOUS | Status: DC
  Administered 2022-07-10 – 2022-07-12 (×3): 2 g via INTRAVENOUS
  Filled 2022-07-10 (×3): qty 20

## 2022-07-10 MED ORDER — ACETAMINOPHEN 325 MG PO TABS: 650.0000 mg | ORAL_TABLET | Freq: Four times a day (QID) | ORAL | Status: DC | PRN

## 2022-07-10 MED ORDER — LACTATED RINGERS IV BOLUS (SEPSIS)
200.0000 mL | Freq: Once | INTRAVENOUS | Status: AC
  Administered 2022-07-10: 200 mL via INTRAVENOUS

## 2022-07-10 MED ORDER — SUMATRIPTAN SUCCINATE 25 MG PO TABS
100.0000 mg | ORAL_TABLET | Freq: Once | ORAL | Status: DC | PRN
  Administered 2022-07-10 – 2022-07-11 (×2): 100 mg via ORAL
  Filled 2022-07-10 (×2): qty 4

## 2022-07-10 MED ORDER — GALANTAMINE HYDROBROMIDE ER 8 MG PO CP24
24.0000 mg | ORAL_CAPSULE | Freq: Every day | ORAL | Status: DC
  Administered 2022-07-10 – 2022-07-11 (×2): 24 mg via ORAL
  Filled 2022-07-10 (×2): qty 3

## 2022-07-10 MED ORDER — DOXEPIN HCL 25 MG PO CAPS
75.0000 mg | ORAL_CAPSULE | Freq: Every day | ORAL | Status: DC
  Administered 2022-07-10: 75 mg via ORAL
  Filled 2022-07-10 (×2): qty 1

## 2022-07-10 MED ORDER — LINACLOTIDE 145 MCG PO CAPS
145.0000 ug | ORAL_CAPSULE | Freq: Every day | ORAL | Status: DC
  Administered 2022-07-11 – 2022-07-12 (×2): 145 ug via ORAL
  Filled 2022-07-10 (×3): qty 1

## 2022-07-10 MED ORDER — LACTATED RINGERS IV SOLN: INTRAVENOUS | Status: AC

## 2022-07-10 MED ORDER — TRAZODONE HCL 100 MG PO TABS
200.0000 mg | ORAL_TABLET | Freq: Every day | ORAL | Status: DC
  Administered 2022-07-10 – 2022-07-11 (×2): 200 mg via ORAL
  Filled 2022-07-10 (×2): qty 2

## 2022-07-10 NOTE — Sepsis Progress Note (Signed)
Notified bedside nurse of need to draw repeat lactic acid. 

## 2022-07-10 NOTE — ED Notes (Signed)
Pt to radiology.
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
## 2022-07-10 NOTE — ED Notes (Signed)
Critical lactic  Acid 2.9 . Provider aware

## 2022-07-10 NOTE — ED Triage Notes (Signed)
Pt fell at home and is complaining of 2/10 left leg pain. Pt also having urinary symptoms and confusion.

## 2022-07-10 NOTE — Sepsis Progress Note (Signed)
Following per sepsis protocol   
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
## 2022-07-10 NOTE — ED Provider Notes (Addendum)
[facility]EMERGENCY DEPT Provider Note   CSN: [id number] Arrival date & time: 07/10/22  2951     History  Chief Complaint  Patient presents with   Fall   Leg Injury    Rickey Rivera is a 71 y.o. male who presents via EMS after fall at home.  Patient confused and altered at time of arrival, able to provide very little insight into his presentation.  States he has been having burning when he pees and feels like "my penis is on fire when I pee.  Also states that he was going to the bathroom many times tonight.  When asked about the fall reported by EMS, he states that "I fell in the fall" and unable to provide any further insight.  He thinks since dinnertime even though it is 5:00 in the morning.  Level 5 caveat due to altered mental status. Collateral history provided to patient's wife Rickey Rivera who presented to the ED shortly after his arrival who states that he had an accident where he urinated on himself earlier in the day and then had been going to the restroom many times through the night subsequently having a fall and being very confused.  I personally reviewed his medical records.  His history of paroxysmal A-fib, hyperlipidemia, migraines, prediabetes.  Patient is not anticoagulated.  [facility]  HPI     Home Medications Prior to Admission medications   Medication Sig Start Date End Date Taking? Authorizing Provider  aspirin 81 MG EC tablet Take 81 mg by mouth daily.   Yes [provider]  diclofenac (VOLTAREN) 75 MG EC tablet Take 75 mg by mouth See admin instructions. One to two times a day as needed for pain   Yes [provider]  doxepin (SINEQUAN) 25 MG capsule Take 75 mg by mouth at bedtime. 02/28/22  Yes [provider]  gabapentin (NEURONTIN) 300 MG capsule Take 600 mg by mouth at bedtime. May also take an additional 300 mg during the day, if needed for anxiety and/or headaches   Yes [provider]  galantamine (RAZADYNE ER) 24 MG 24 hr capsule Take 24 mg by mouth at bedtime. 04/23/22 up to 2-3 a night 02/28/22  Yes [provider]  levothyroxine (SYNTHROID, LEVOTHROID) 100 MCG tablet Take 100 mcg by mouth daily before breakfast.   Yes [provider]  linaclotide (LINZESS) 145 MCG CAPS capsule Take 145 mcg by mouth daily before breakfast. Take at least 30 minutes prior to the first meal of the day 01/23/22  Yes [provider]  melatonin 3 MG TABS tablet Take 9 mg by mouth at bedtime. 09/12/20  Yes [provider]  memantine (NAMENDA) 10 MG tablet Take 20 mg by mouth at bedtime. to slow memory loss 02/28/22  Yes [provider]  METAMUCIL FIBER PO 5 mLs by Other route daily as needed (constipation). Mix 1 teaspoonful in 8 ounces of water or juice and drink daily as needed for constipation 05/30/21  Yes [provider]  metoprolol tartrate (LOPRESSOR) 50 MG tablet Take 50 mg by mouth every 12 (twelve) hours. 11/07/20  Yes [provider]  naproxen (NAPROSYN) 500 MG tablet Take 500 mg by mouth 2 (two) times daily. 05/22/21  Yes [provider]  omeprazole (PRILOSEC) 40 MG capsule Take 40 mg by mouth daily. 06/14/21  Yes [provider]  Oxcarbazepine (TRILEPTAL) 300 MG tablet Take 600 mg by mouth 2 (two) times  daily. 05/22/21  Yes [provider]  SUMAtriptan (IMITREX) 100 MG tablet Take 100 mg by mouth once as needed for migraine or headache (may repeat once in 2 hours (if no relief)). May repeat in 2 hours if headache persists or recurs.   Yes [provider]  traZODone (DESYREL) 100 MG tablet Take 200 mg by mouth at bedtime. 03/28/21  Yes [provider]  venlafaxine XR (EFFEXOR-XR) 75 MG 24 hr capsule Take 75 mg by mouth daily with breakfast.   Yes [provider]      Allergies    Bupropion and Citalopram    Review of Systems   Review of Systems  Unable to perform  ROS: Mental status change  Genitourinary:  Positive for dysuria, frequency and urgency. Negative for difficulty urinating, penile pain, penile swelling, scrotal swelling and testicular pain.    Physical Exam Updated Vital Signs BP 109/83   Pulse 100   Temp 97.9 F (36.6 C) (Oral)   Resp 16   Ht 5\' 8"  (1.727 m)   Wt 122.5 kg   SpO2 95%   BMI 41.05 kg/m  Physical Exam Vitals and nursing note reviewed.  Constitutional:      Appearance: He is obese. He is not ill-appearing or toxic-appearing.  HENT:     Head: Normocephalic and atraumatic.     Nose: Nose normal.     Mouth/Throat:     Mouth: Mucous membranes are moist.     Pharynx: No oropharyngeal exudate or posterior oropharyngeal erythema.  Eyes:     General:        Right eye: No discharge.        Left eye: No discharge.     Extraocular Movements: Extraocular movements intact.     Conjunctiva/sclera: Conjunctivae normal.     Pupils: Pupils are equal, round, and reactive to light.  Cardiovascular:     Rate and Rhythm: Regular rhythm. Tachycardia present.     Pulses: Normal pulses.     Heart sounds: Normal heart sounds. No murmur heard. Pulmonary:     Effort: Pulmonary effort is normal. No respiratory distress.     Breath sounds: Normal breath sounds. No wheezing or rales.  Abdominal:     General: Bowel sounds are normal. There is no distension.     Palpations: Abdomen is soft.     Tenderness: There is no abdominal tenderness. There is no right CVA tenderness, left CVA tenderness, guarding or rebound.  Musculoskeletal:        General: No deformity.     Cervical back: Neck supple.     Right lower leg: No edema.     Left lower leg: No edema.  Skin:    General: Skin is warm and dry.     Capillary Refill: Capillary refill takes less than 2 seconds.  Neurological:     Mental Status: He is alert. He is disoriented.     GCS: GCS eye subscore is 4. GCS verbal subscore is 4. GCS motor subscore is 6.     Cranial Nerves:  Cranial nerves 2-12 are intact.     Comments: He thinks it is dinner time, though it is 5AM. When asked about his fall, he repeats "I fell in the fall." Laughing inappropriately, unable to provider much insight into symptomatology  Psychiatric:        Mood and Affect: Mood normal.     ED Results / Procedures / Treatments   Labs (all labs ordered are listed, but only  abnormal results are displayed) Labs Reviewed  LACTIC ACID, PLASMA - Abnormal; Notable for the following components:      Result Value   Lactic Acid, Venous 2.9 (*)    All other components within normal limits  COMPREHENSIVE METABOLIC PANEL - Abnormal; Notable for the following components:   Glucose, Bld 155 (*)    AST 43 (*)    All other components within normal limits  CBC WITH DIFFERENTIAL/PLATELET - Abnormal; Notable for the following components:   WBC 15.0 (*)    Neutro Abs 13.3 (*)    Lymphs Abs 0.6 (*)    All other components within normal limits  APTT - Abnormal; Notable for the following components:   aPTT 23 (*)    All other components within normal limits  URINALYSIS, ROUTINE W REFLEX MICROSCOPIC - Abnormal; Notable for the following components:   Color, Urine AMBER (*)    APPearance CLOUDY (*)    Specific Gravity, Urine >1.046 (*)    Hgb urine dipstick LARGE (*)    Bilirubin Urine SMALL (*)    Ketones, ur 5 (*)    Protein, ur >=300 (*)    Leukocytes,Ua LARGE (*)    RBC / HPF >50 (*)    WBC, UA >50 (*)    All other components within normal limits  CULTURE, BLOOD (ROUTINE X 2)  CULTURE, BLOOD (ROUTINE X 2)  URINE CULTURE  PROTIME-INR  LACTIC ACID, PLASMA    EKG None  Radiology CT Head Wo Contrast  Result Date: 07/10/2022 CLINICAL DATA:  Larey SeatFell at home with head trauma. EXAM: CT HEAD WITHOUT CONTRAST TECHNIQUE: Contiguous axial images were obtained from the base of the skull through the vertex without intravenous contrast. RADIATION DOSE REDUCTION: This exam was performed according to the  departmental dose-optimization program which includes automated exposure control, adjustment of the mA and/or kV according to patient size and/or use of iterative reconstruction technique. COMPARISON:  MRI brain 04/10/2022, head CT 03/21/2022 FINDINGS: Brain: There is mild-to-moderate cerebral atrophy, small-vessel disease and atrophic ventriculomegaly. There is slight cerebellar atrophy. No asymmetry is seen concerning for acute hemorrhage, cortical based infarct or mass. There is no midline shift. Basal cisterns are clear. Vascular: There are calcifications of the carotid siphons but no hyperdense central vessels. Skull: There is no appreciable scalp hematoma. No skull fracture or focal skull lesions are seen. Sinuses/Orbits: There is mild chronic membrane thickening in the ethmoids and maxillary sinuses without fluid levels. Other sinuses and mastoid air cells are clear. Unremarkable orbital contents. Other: None. IMPRESSION: No acute intracranial CT findings, depressed skull fractures, or interval changes over recent studies. Electronically Signed   By: Almira BarKeith  Chesser M.D.   On: 07/10/2022 06:45   DG Chest Port 1 View  Result Date: 07/10/2022 CLINICAL DATA:  Multiple falls over the past few weeks. EXAM: PORTABLE CHEST 1 VIEW COMPARISON:  03/21/2022 FINDINGS: Very low volume chest. No effusion, pneumothorax, or convincing infiltrate. Negative heart size and mediastinal contours when accounting for technique. IMPRESSION: Very low volume chest.  No acute finding. Electronically Signed   By: Tiburcio PeaJonathan  Watts M.D.   On: 07/10/2022 05:45   DG Pelvis Portable  Result Date: 07/10/2022 CLINICAL DATA:  History of fall. EXAM: PORTABLE PELVIS 1-2 VIEWS COMPARISON:  None Available. FINDINGS: There is no evidence of pelvic fracture or diastasis. No pelvic bone lesions are seen. IMPRESSION: Negative. Electronically Signed   By: Tiburcio PeaJonathan  Watts M.D.   On: 07/10/2022 05:43    Procedures .Critical Care  Performed  by:  Emeline Darling, PA-C Authorized by: Emeline Darling, PA-C   Critical care provider statement:    Critical care time (minutes):  45   Critical care was time spent personally by me on the following activities:  Development of treatment plan with patient or surrogate, discussions with consultants, evaluation of patient's response to treatment, examination of patient, obtaining history from patient or surrogate, ordering and performing treatments and interventions, ordering and review of laboratory studies, ordering and review of radiographic studies, pulse oximetry and re-evaluation of patient's condition     Medications Ordered in ED Medications  lactated ringers infusion ( Intravenous New Bag/Given 07/10/22 0546)  lactated ringers bolus 1,000 mL (1,000 mLs Intravenous New Bag/Given 07/10/22 0600)    And  lactated ringers bolus 200 mL (has no administration in time range)  cefTRIAXone (ROCEPHIN) 2 g in sodium chloride 0.9 % 100 mL IVPB (0 g Intravenous Stopped 07/10/22 0625)  lactated ringers bolus 1,000 mL (0 mLs Intravenous Stopped 07/10/22 0655)    ED Course/ Medical Decision Making/ A&P Clinical Course as of 07/10/22 0719  Tue Jul 10, 2022  0718 Consult to Dr. Marylyn Ishihara, hospitalist, who is agreeable to admitting this patient to his service. I appreciate his collaboration in the care of this patient. [RS]    Clinical Course User Index [RS] Cambell Rickenbach, Gypsy Balsam, PA-C                           Medical Decision Making 71 year old male presents with concern for altered mental status.  Tachycardic, hypertensive on intake vital signs otherwise normal.  Cardiopulmonary exam is as above with tachycardia but otherwise unremarkable.  Abdominal exam is benign.  GU exam unremarkable, patient with GCS of 14 but very confused.  No pelvic instability on exam, patient not complaining of pain at any extremity at this time.  The differential diagnosis for AMS is extensive and includes, but is not  limited to:  Drug overdose - opioids, alcohol, sedatives, antipsychotics, drug withdrawal, others Metabolic: hypoxia, hypoglycemia, hyperglycemia, hypercalcemia, hypernatremia, hyponatremia, uremia, hepatic encephalopathy, hypothyroidism, hyperthyroidism, vitamin B12 or thiamine deficiency, carbon monoxide poisoning, Wilson's disease, Lactic acidosis, DKA/HHOS Infectious: meningitis, encephalitis, bacteremia/sepsis, urinary tract infection, pneumonia, neurosyphilis Structural: Space-occupying lesion, (brain tumor, subdural hematoma, hydrocephalus,) Vascular: stroke, subarachnoid hemorrhage, coronary ischemia, hypertensive encephalopathy, CNS vasculitis, thrombotic thrombocytopenic purpura, disseminated intravascular coagulation, hyperviscosity Psychiatric: Schizophrenia, depression; Other: Seizure, hypothermia, heat stroke, ICU psychosis, dementia -"sundowning."    Amount and/or Complexity of Data Reviewed Labs: ordered.    Details: CBC with leukocytosis of 15,000, no anemia.  CMP with mild elevation in AST to 43, otherwise unremarkable.  INR is normal, lactic acid is elevated 2.9. UA convincing for UTI.  Radiology: ordered.    Details: CT head negative for acute intracranial abnormality, chest x-ray and pelvic x-ray negative for acute injuries or cardiopulmonary disease.  ECG/medicine tests: ordered.    Details:  EKG with sinus tachycardia without STEMI.  Risk Prescription drug management. Decision regarding hospitalization.  Patient will require admission to the hospital for urosepsis. Consult to Dr. Marylyn Ishihara, hospitalist, who is agreeable to admitting this patient to his service. I appreciate his collaboration in the care of this patient.   Shaen and his wife  voiced understanding of his medical evaluation and treatment plan. Each of their questions answered to their expressed satisfaction.  They are amenable to plan for admission at this time.   This chart was dictated using voice  recognition software, Dragon.  Despite the best efforts of this provider to proofread and correct errors, errors may still occur which can change documentation meaning.  Final Clinical Impression(s) / ED Diagnoses Final diagnoses:  Sepsis without acute organ dysfunction, due to unspecified organism [facility])  Acute cystitis with hematuria    Rx / DC Orders ED Discharge Orders     None           Emeline Darling, PA-C 07/10/22 0719    Yides Saidi, Gypsy Balsam, PA-C 07/10/22 0720    Jeanell Sparrow, DO 07/10/22 [phone]

## 2022-07-11 DIAGNOSIS — A419 Sepsis, unspecified organism: Secondary | ICD-10-CM | POA: Diagnosis not present

## 2022-07-11 DIAGNOSIS — W19XXXA Unspecified fall, initial encounter: Secondary | ICD-10-CM | POA: Diagnosis not present

## 2022-07-11 DIAGNOSIS — E039 Hypothyroidism, unspecified: Secondary | ICD-10-CM

## 2022-07-11 DIAGNOSIS — E785 Hyperlipidemia, unspecified: Secondary | ICD-10-CM

## 2022-07-11 DIAGNOSIS — Y92009 Unspecified place in unspecified non-institutional (private) residence as the place of occurrence of the external cause: Secondary | ICD-10-CM

## 2022-07-11 DIAGNOSIS — G9341 Metabolic encephalopathy: Secondary | ICD-10-CM | POA: Diagnosis not present

## 2022-07-11 LAB — COMPREHENSIVE METABOLIC PANEL
ALT: 28 U/L (ref 0–44)
AST: 88 U/L — ABNORMAL HIGH (ref 15–41)
Albumin: 3.7 g/dL (ref 3.5–5.0)
Alkaline Phosphatase: 96 U/L (ref 38–126)
Anion gap: 7 (ref 5–15)
BUN: 8 mg/dL (ref 8–23)
CO2: 24 mmol/L (ref 22–32)
Calcium: 10.2 mg/dL (ref 8.9–10.3)
Chloride: 109 mmol/L (ref 98–111)
Creatinine, Ser: 0.74 mg/dL (ref 0.61–1.24)
GFR, Estimated: >60 mL/min (ref >60)
Glucose, Bld: 135 mg/dL — ABNORMAL HIGH (ref 70–99)
Potassium: 4 mmol/L (ref 3.5–5.1)
Sodium: 140 mmol/L (ref 135–145)
Total Bilirubin: 0.7 mg/dL (ref 0.3–1.2)
Total Protein: 6.5 g/dL (ref 6.5–8.1)

## 2022-07-11 LAB — CBC
HCT: 38.7 % — ABNORMAL LOW (ref 39.0–52.0)
Hemoglobin: 12.5 g/dL — ABNORMAL LOW (ref 13.0–17.0)
MCH: 30.4 pg (ref 26.0–34.0)
MCHC: 32.3 g/dL (ref 30.0–36.0)
MCV: 94.2 fL (ref 80.0–100.0)
Platelets: 182 10*3/uL (ref 150–400)
RBC: 4.11 MIL/uL — ABNORMAL LOW (ref 4.22–5.81)
RDW: 13.3 % (ref 11.5–15.5)
WBC: 11.1 10*3/uL — ABNORMAL HIGH (ref 4.0–10.5)
nRBC: 0 % (ref 0.0–0.2)

## 2022-07-11 LAB — PROTIME-INR
INR: 1.1 (ref 0.8–1.2)
Prothrombin Time: 14.4 seconds (ref 11.4–15.2)

## 2022-07-11 LAB — CORTISOL-AM, BLOOD: Cortisol - AM: 5.3 ug/dL — ABNORMAL LOW (ref 6.7–22.6)

## 2022-07-11 LAB — PROCALCITONIN: Procalcitonin: 0.62 ng/mL

## 2022-07-11 MED ORDER — LACTATED RINGERS IV BOLUS
1000.0000 mL | Freq: Once | INTRAVENOUS | Status: AC
  Administered 2022-07-11: 1000 mL via INTRAVENOUS

## 2022-07-11 MED ORDER — LACTATED RINGERS IV SOLN: INTRAVENOUS | Status: AC

## 2022-07-11 MED ORDER — METOPROLOL TARTRATE 50 MG PO TABS
50.0000 mg | ORAL_TABLET | Freq: Two times a day (BID) | ORAL | Status: DC
  Administered 2022-07-11 – 2022-07-12 (×3): 50 mg via ORAL
  Filled 2022-07-11 (×3): qty 1

## 2022-07-11 NOTE — Assessment & Plan Note (Signed)
Continue gabapentin.

## 2022-07-11 NOTE — Progress Notes (Signed)
  Progress Note   Patient: Rickey Rivera ZOX:[id number] DOB: 04-11-1951 DOA: 07/10/2022     1 DOS: the patient was seen and examined on 07/11/2022 at 12:05 PM      Brief hospital course: Rickey Rivera is a 71 y.o. M with history of TBI/cognitive impairment, peripheral neuropathy, hypothyroidism, atrial fibrillation not on anticoagulation, morbid obesity, and sleep apnea not on CPAP who presented with fall   In the ER, found to have UTI.         Assessment and Plan: * Sepsis with end organ damage P/w leukocytosis, tachycardia, tachypnea, elevated lactic acid, and acute metabolic encephalopathy.  Suspected source UTI.  Urine culture growing E. coli, sensitivities pending, Blood cultures negative -Continue Rocephin  Acute metabolic encephalopathy Presented with disorientation, GCS <15, responding inappropriately to questions.  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  Peripheral neuropathy - Continue gabapentin  Morbid obesity BMI 41  Hyperlipidemia Not on statin  Hypothyroidism - Continue levothyroxine          Subjective: Patient is feeling slightly better, still severely weak and tired Still slightly confused.  No fever, no flank pain, no vomiting.     Physical Exam: BP 113/74 (BP Location: Right Arm)   Pulse (!) 105   Temp 98.1 F (36.7 C) (Oral)   Resp 19   Ht 5\' 8"  (1.727 m)   Wt 122.5 kg   SpO2 94%   BMI 41.05 kg/m   Adult male, lying in bed, appears weak and tired, mouth is dry, still very tachycardic 120s Tachycardic, regular, no murmurs, no peripheral edema Respiratory rate normal, lungs clear without rales or wheezes Oropharynx moist, no oral lesions Abdomen soft no tenderness palpation or guarding, no  ascites or distention Attention somewhat distracted, affect blunted, judgment insight appear normal severe generalized weakness     Data Reviewed: Basic metabolic panel normal LFTs normal CBC shows white count 11 Urine culture with gram-negative rods Blood cultures no growth CT head unremarkable  Family Communication: Sister at the bedside    Disposition: Status is: Inpatient         Author: , MD 07/11/2022 5:46 PM  For on call review [email or web address].

## 2022-07-11 NOTE — Assessment & Plan Note (Signed)
BMI 41 

## 2022-07-11 NOTE — Hospital Course (Signed)
71 y.o. male with medical history significant of hypothyroidism, GERD, brain injury/memory deficit, neuropathic pain. Presenting with a fall. Has sepsis secondary to UTI. Started on rocephin

## 2022-07-11 NOTE — Assessment & Plan Note (Signed)
On aspirin only.

## 2022-07-11 NOTE — Assessment & Plan Note (Signed)
Continue levothyroxine 

## 2022-07-11 NOTE — Assessment & Plan Note (Signed)
-   Continue galantamine, oxcarbazepine, Imitrex, venlafaxine

## 2022-07-11 NOTE — Assessment & Plan Note (Signed)
Not on statin 

## 2022-07-11 NOTE — Assessment & Plan Note (Addendum)
P/w leukocytosis, tachycardia, tachypnea, elevated lactic acid, and acute metabolic encephalopathy.  Suspected source UTI.  Urine culture growing E. coli, sensitivities pending, Blood cultures negative -Continue Rocephin

## 2022-07-11 NOTE — Assessment & Plan Note (Addendum)
Presented with disorientation, GCS <15, responding inappropriately to questions.  Seems to be improving - Hold doxepin at night

## 2022-07-12 ENCOUNTER — Other Ambulatory Visit (HOSPITAL_COMMUNITY): Payer: Self-pay

## 2022-07-12 DIAGNOSIS — A419 Sepsis, unspecified organism: Secondary | ICD-10-CM | POA: Diagnosis not present

## 2022-07-12 LAB — CBC
HCT: 37.8 % — ABNORMAL LOW (ref 39.0–52.0)
Hemoglobin: 12.1 g/dL — ABNORMAL LOW (ref 13.0–17.0)
MCH: 30.6 pg (ref 26.0–34.0)
MCHC: 32 g/dL (ref 30.0–36.0)
MCV: 95.7 fL (ref 80.0–100.0)
Platelets: 196 10*3/uL (ref 150–400)
RBC: 3.95 MIL/uL — ABNORMAL LOW (ref 4.22–5.81)
RDW: 13.3 % (ref 11.5–15.5)
WBC: 7.7 10*3/uL (ref 4.0–10.5)
nRBC: 0 % (ref 0.0–0.2)

## 2022-07-12 LAB — BASIC METABOLIC PANEL
Anion gap: 8 (ref 5–15)
BUN: 10 mg/dL (ref 8–23)
CO2: 25 mmol/L (ref 22–32)
Calcium: 9.9 mg/dL (ref 8.9–10.3)
Chloride: 106 mmol/L (ref 98–111)
Creatinine, Ser: 0.86 mg/dL (ref 0.61–1.24)
GFR, Estimated: >60 mL/min (ref >60)
Glucose, Bld: 136 mg/dL — ABNORMAL HIGH (ref 70–99)
Potassium: 4 mmol/L (ref 3.5–5.1)
Sodium: 139 mmol/L (ref 135–145)

## 2022-07-12 LAB — URINE CULTURE: Culture: >=100000 — AB

## 2022-07-12 MED ORDER — CEFADROXIL 500 MG PO CAPS: 500.0000 mg | ORAL_CAPSULE | Freq: Two times a day (BID) | ORAL | Status: DC

## 2022-07-12 MED ORDER — CEFADROXIL 500 MG PO CAPS
500.0000 mg | ORAL_CAPSULE | Freq: Two times a day (BID) | ORAL | 0 refills | Status: DC
  Filled 2022-07-12: qty 14, 7d supply, fill #0
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
## 2022-07-12 NOTE — TOC Transition Note (Addendum)
Transition of Care [facility]) - CM/SW Discharge Note   Patient Details  Name: Rickey Rivera MRN: [id number] Date of Birth: 02/22/1951  Transition of Care [facility] CM/SW Contact:  Lanier Clam, RN Phone Number: 07/12/2022, 1:55 PM   Clinical Narrative:  Patient prefers to use VA benefit for HHPT-PCP-Dr. Lottie Dawson [facility] HHC form to [place]. [facility] for HHPT rep Misty Stanley aware. Declines SNF.Has own transport home.     Final next level of care: Home w Home Health Services Barriers to Discharge: No Barriers Identified   Patient Goals and CMS Choice Patient states their goals for this hospitalization and ongoing recovery are::  (Home) CMS Medicare.gov Compare Post Acute Care list provided to:: Patient Choice offered to / list presented to : Patient  Discharge Placement                       Discharge Plan and Services   Discharge Planning Services: CM Consult Post Acute Care Choice: Home Health                    HH Arranged: PT [facility] Agency: [facility] Date [facility] Agency Contacted: 07/12/22 Time HH Agency Contacted: [phone] Representative spoke with at [facility] Agency: Misty Stanley  Social Determinants of Health (SDOH) Interventions     Readmission Risk Interventions     No data to display

## 2022-07-15 LAB — CULTURE, BLOOD (ROUTINE X 2)
Culture: NO GROWTH
Culture: NO GROWTH

## 2022-09-05 ENCOUNTER — Emergency Department (HOSPITAL_BASED_OUTPATIENT_CLINIC_OR_DEPARTMENT_OTHER): Payer: No Typology Code available for payment source

## 2022-09-05 ENCOUNTER — Encounter (HOSPITAL_BASED_OUTPATIENT_CLINIC_OR_DEPARTMENT_OTHER): Payer: Self-pay | Admitting: Emergency Medicine

## 2022-09-05 ENCOUNTER — Other Ambulatory Visit: Payer: Self-pay

## 2022-09-05 ENCOUNTER — Observation Stay (HOSPITAL_BASED_OUTPATIENT_CLINIC_OR_DEPARTMENT_OTHER)
Admission: EM | Admit: 2022-09-05 | Discharge: 2022-09-12 | Disposition: A | Discharge status: Skilled Nursing Facility | Payer: No Typology Code available for payment source | Attending: Family Medicine | Admitting: Family Medicine

## 2022-09-05 ENCOUNTER — Encounter (HOSPITAL_COMMUNITY): Payer: Self-pay

## 2022-09-05 DIAGNOSIS — S060X0A Concussion without loss of consciousness, initial encounter: Secondary | ICD-10-CM | POA: Insufficient documentation

## 2022-09-05 DIAGNOSIS — Z79899 Other long term (current) drug therapy: Secondary | ICD-10-CM | POA: Insufficient documentation

## 2022-09-05 DIAGNOSIS — G9009 Other idiopathic peripheral autonomic neuropathy: Secondary | ICD-10-CM | POA: Insufficient documentation

## 2022-09-05 DIAGNOSIS — R531 Weakness: Secondary | ICD-10-CM | POA: Diagnosis not present

## 2022-09-05 DIAGNOSIS — R109 Unspecified abdominal pain: Secondary | ICD-10-CM | POA: Diagnosis not present

## 2022-09-05 DIAGNOSIS — G9340 Encephalopathy, unspecified: Principal | ICD-10-CM | POA: Insufficient documentation

## 2022-09-05 DIAGNOSIS — R296 Repeated falls: Secondary | ICD-10-CM | POA: Insufficient documentation

## 2022-09-05 DIAGNOSIS — I152 Hypertension secondary to endocrine disorders: Secondary | ICD-10-CM

## 2022-09-05 DIAGNOSIS — G309 Alzheimer's disease, unspecified: Secondary | ICD-10-CM

## 2022-09-05 DIAGNOSIS — R413 Other amnesia: Secondary | ICD-10-CM | POA: Diagnosis present

## 2022-09-05 DIAGNOSIS — R32 Unspecified urinary incontinence: Secondary | ICD-10-CM | POA: Insufficient documentation

## 2022-09-05 DIAGNOSIS — K588 Other irritable bowel syndrome: Secondary | ICD-10-CM

## 2022-09-05 DIAGNOSIS — G43819 Other migraine, intractable, without status migrainosus: Secondary | ICD-10-CM

## 2022-09-05 DIAGNOSIS — F321 Major depressive disorder, single episode, moderate: Secondary | ICD-10-CM

## 2022-09-05 DIAGNOSIS — E039 Hypothyroidism, unspecified: Secondary | ICD-10-CM | POA: Diagnosis not present

## 2022-09-05 DIAGNOSIS — R410 Disorientation, unspecified: Secondary | ICD-10-CM

## 2022-09-05 DIAGNOSIS — G63 Polyneuropathy in diseases classified elsewhere: Secondary | ICD-10-CM

## 2022-09-05 DIAGNOSIS — Z7982 Long term (current) use of aspirin: Secondary | ICD-10-CM | POA: Insufficient documentation

## 2022-09-05 DIAGNOSIS — G629 Polyneuropathy, unspecified: Secondary | ICD-10-CM

## 2022-09-05 DIAGNOSIS — I480 Paroxysmal atrial fibrillation: Secondary | ICD-10-CM | POA: Diagnosis not present

## 2022-09-05 DIAGNOSIS — E1159 Type 2 diabetes mellitus with other circulatory complications: Secondary | ICD-10-CM

## 2022-09-05 DIAGNOSIS — F0280 Dementia in other diseases classified elsewhere without behavioral disturbance: Secondary | ICD-10-CM

## 2022-09-05 DIAGNOSIS — F419 Anxiety disorder, unspecified: Secondary | ICD-10-CM | POA: Diagnosis present

## 2022-09-05 DIAGNOSIS — R29898 Other symptoms and signs involving the musculoskeletal system: Secondary | ICD-10-CM

## 2022-09-05 DIAGNOSIS — R4182 Altered mental status, unspecified: Secondary | ICD-10-CM | POA: Diagnosis present

## 2022-09-05 LAB — COMPREHENSIVE METABOLIC PANEL
ALT: 17 U/L (ref 0–44)
AST: 23 U/L (ref 15–41)
Albumin: 4 g/dL (ref 3.5–5.0)
Alkaline Phosphatase: 95 U/L (ref 38–126)
Anion gap: 6 (ref 5–15)
BUN: 18 mg/dL (ref 8–23)
CO2: 25 mmol/L (ref 22–32)
Calcium: 9.7 mg/dL (ref 8.9–10.3)
Chloride: 106 mmol/L (ref 98–111)
Creatinine, Ser: 0.99 mg/dL (ref 0.61–1.24)
GFR, Estimated: >60 mL/min (ref >60)
Glucose, Bld: 148 mg/dL — ABNORMAL HIGH (ref 70–99)
Potassium: 4.2 mmol/L (ref 3.5–5.1)
Sodium: 137 mmol/L (ref 135–145)
Total Bilirubin: 0.5 mg/dL (ref 0.3–1.2)
Total Protein: 7 g/dL (ref 6.5–8.1)

## 2022-09-05 LAB — CBC WITH DIFFERENTIAL/PLATELET
Abs Immature Granulocytes: 0.03 10*3/uL (ref 0.00–0.07)
Basophils Absolute: 0.1 10*3/uL (ref 0.0–0.1)
Basophils Relative: 1 %
Eosinophils Absolute: 0.2 10*3/uL (ref 0.0–0.5)
Eosinophils Relative: 3 %
HCT: 40.8 % (ref 39.0–52.0)
Hemoglobin: 13.3 g/dL (ref 13.0–17.0)
Immature Granulocytes: 1 %
Lymphocytes Relative: 18 %
Lymphs Abs: 1.1 10*3/uL (ref 0.7–4.0)
MCH: 30.4 pg (ref 26.0–34.0)
MCHC: 32.6 g/dL (ref 30.0–36.0)
MCV: 93.2 fL (ref 80.0–100.0)
Monocytes Absolute: 0.6 10*3/uL (ref 0.1–1.0)
Monocytes Relative: 9 %
Neutro Abs: 4.2 10*3/uL (ref 1.7–7.7)
Neutrophils Relative %: 68 %
Platelets: 203 10*3/uL (ref 150–400)
RBC: 4.38 MIL/uL (ref 4.22–5.81)
RDW: 13.2 % (ref 11.5–15.5)
WBC: 6.1 10*3/uL (ref 4.0–10.5)
nRBC: 0 % (ref 0.0–0.2)

## 2022-09-05 LAB — URINALYSIS, ROUTINE W REFLEX MICROSCOPIC
Bilirubin Urine: NEGATIVE
Glucose, UA: NEGATIVE mg/dL
Hgb urine dipstick: NEGATIVE
Ketones, ur: NEGATIVE mg/dL
Leukocytes,Ua: NEGATIVE
Nitrite: NEGATIVE
Protein, ur: NEGATIVE mg/dL
Specific Gravity, Urine: >=1.03 (ref 1.005–1.030)
pH: 5 (ref 5.0–8.0)

## 2022-09-05 MED ORDER — IOHEXOL 350 MG/ML SOLN
100.0000 mL | Freq: Once | INTRAVENOUS | Status: AC | PRN
  Administered 2022-09-05: 100 mL via INTRAVENOUS
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
## 2022-09-05 NOTE — ED Triage Notes (Signed)
Reports urinary retention since last night at PM. Denies pain .

## 2022-09-05 NOTE — Discharge Instructions (Addendum)
Dear Philis Pique,   Thank you for letting us participate in your care! In this section, you will find a brief hospital admission summary of why you were admitted to the hospital and post-hospital plan.  You were admitted because you were having falls and worsening memory. Your memory issues is likely due to worsening cognitive function from your prior brain injury. Neurosurgery was consulted and determined no surgical intervention. The frequent falls is related to your cervical spine problem. You will work with physical therapy and neurology will follow up with you after discharge.   POST-HOSPITAL & CARE INSTRUCTIONS Please let PCP/Specialists know of any changes in medications that were made.  Please see medications section of this packet for any medication changes.   DOCTOR'S APPOINTMENTS & FOLLOW UP No future appointments.   Thank you for choosing [facility]! Take care and be well!  [facility]  [place], [place] [address] [phone]

## 2022-09-05 NOTE — ED Notes (Signed)
Bladder scan at 63 ML

## 2022-09-05 NOTE — ED Notes (Signed)
Pt denies pain or discomfort at this time. Call bell within reach, family at bedside. Will continue to monitor.

## 2022-09-05 NOTE — ED Provider Notes (Signed)
I provided a substantive portion of the care of this patient.  I personally performed the entirety of the history for this encounter.    Patient has chronic problems with confusion and mobility difficulty over the past number of months.  Symptoms have been worsening.  Recently now he has had several falls and also loss of urinary continence.  Extensive review of EMR indicates patient had evaluation with neurology for lower extremity weakness and cognitive impairment.  MRIs of brain cervical spine thoracic and lumbar spine have been done with moderate spinal canal stenosis in the neck.  Patient has combination of chronic decline and over the past week or so acceleration of gait instability.  Patient also has recent hospitalization for UTI encephalopathy.  Agree with consult neurology regarding recurrent MRI imaging for advancing stenosis or other acute events of stroke.  I agree with plan of management.  I spent extensive time reviewing history and present illness with the patient and his wife at bedside.  Also extensive review of EMR for planning and decision making.   Charlesetta Shanks, MD 09/07/22 2342

## 2022-09-06 ENCOUNTER — Other Ambulatory Visit: Payer: Self-pay

## 2022-09-06 ENCOUNTER — Emergency Department (HOSPITAL_COMMUNITY): Payer: No Typology Code available for payment source

## 2022-09-06 DIAGNOSIS — G9340 Encephalopathy, unspecified: Secondary | ICD-10-CM | POA: Diagnosis present

## 2022-09-06 DIAGNOSIS — R296 Repeated falls: Secondary | ICD-10-CM | POA: Diagnosis not present

## 2022-09-06 DIAGNOSIS — R32 Unspecified urinary incontinence: Secondary | ICD-10-CM | POA: Diagnosis present

## 2022-09-06 LAB — RAPID URINE DRUG SCREEN, HOSP PERFORMED
Amphetamines: NOT DETECTED
Barbiturates: NOT DETECTED
Benzodiazepines: NOT DETECTED
Cocaine: NOT DETECTED
Opiates: NOT DETECTED
Tetrahydrocannabinol: NOT DETECTED

## 2022-09-06 LAB — TSH: TSH: 14.907 u[IU]/mL — ABNORMAL HIGH (ref 0.350–4.500)

## 2022-09-06 LAB — VITAMIN B12: Vitamin B-12: 431 pg/mL (ref 180–914)

## 2022-09-06 MED ORDER — GABAPENTIN 300 MG PO CAPS
600.0000 mg | ORAL_CAPSULE | Freq: Every day | ORAL | Status: DC
  Administered 2022-09-06 – 2022-09-11 (×6): 600 mg via ORAL
  Filled 2022-09-06 (×6): qty 2

## 2022-09-06 MED ORDER — VENLAFAXINE HCL ER 150 MG PO CP24
300.0000 mg | ORAL_CAPSULE | Freq: Every day | ORAL | Status: DC
  Administered 2022-09-06 – 2022-09-12 (×7): 300 mg via ORAL
  Filled 2022-09-06 (×8): qty 2

## 2022-09-06 MED ORDER — ENOXAPARIN SODIUM 40 MG/0.4ML IJ SOSY: 40.0000 mg | PREFILLED_SYRINGE | INTRAMUSCULAR | Status: DC

## 2022-09-06 MED ORDER — OXCARBAZEPINE 300 MG PO TABS
600.0000 mg | ORAL_TABLET | Freq: Two times a day (BID) | ORAL | Status: DC
  Administered 2022-09-06 – 2022-09-12 (×13): 600 mg via ORAL
  Filled 2022-09-06 (×15): qty 2

## 2022-09-06 MED ORDER — METOPROLOL TARTRATE 25 MG PO TABS
50.0000 mg | ORAL_TABLET | Freq: Once | ORAL | Status: AC
  Administered 2022-09-06: 50 mg via ORAL
  Filled 2022-09-06: qty 2

## 2022-09-06 MED ORDER — GALANTAMINE HYDROBROMIDE ER 8 MG PO CP24
24.0000 mg | ORAL_CAPSULE | Freq: Every day | ORAL | Status: DC
  Administered 2022-09-06 – 2022-09-11 (×6): 24 mg via ORAL
  Filled 2022-09-06 (×7): qty 3

## 2022-09-06 MED ORDER — MEMANTINE HCL 10 MG PO TABS
20.0000 mg | ORAL_TABLET | Freq: Every day | ORAL | Status: DC
  Administered 2022-09-06 – 2022-09-11 (×6): 20 mg via ORAL
  Filled 2022-09-06 (×6): qty 2

## 2022-09-06 MED ORDER — METOPROLOL TARTRATE 25 MG PO TABS
25.0000 mg | ORAL_TABLET | Freq: Two times a day (BID) | ORAL | Status: DC
  Administered 2022-09-06 – 2022-09-12 (×13): 25 mg via ORAL
  Filled 2022-09-06 (×13): qty 1

## 2022-09-06 MED ORDER — LEVOTHYROXINE SODIUM 75 MCG PO TABS
150.0000 ug | ORAL_TABLET | Freq: Every day | ORAL | Status: DC
  Administered 2022-09-07: 150 ug via ORAL
  Filled 2022-09-06: qty 2

## 2022-09-06 MED ORDER — GADOBUTROL 1 MMOL/ML IV SOLN
10.0000 mL | Freq: Once | INTRAVENOUS | Status: AC | PRN
Start: 2022-09-06 — End: 2022-09-06
  Administered 2022-09-06: 10 mL via INTRAVENOUS

## 2022-09-06 MED ORDER — LINACLOTIDE 145 MCG PO CAPS
145.0000 ug | ORAL_CAPSULE | Freq: Every day | ORAL | Status: DC
  Administered 2022-09-07 – 2022-09-12 (×6): 145 ug via ORAL
  Filled 2022-09-06 (×6): qty 1

## 2022-09-06 NOTE — Assessment & Plan Note (Addendum)
Neuro exam without focality but with marked BLE weakness and urinary incontinence. Has been having increasing confusion and falls. Per neurosurgery, residual myelopathy is expected but does not account for the progressive memory loss and weakness. Consider reconsulting neurology. - PT recs SNF/OT recs home health - Fall precautions - Out of bed with assistance - Continuous cardiac telemetry

## 2022-09-06 NOTE — Progress Notes (Signed)
Neurosurgery  Patient seen and examined.  He is alert but oriented x 2, cognitive deficit and confusion limits detailed strength examination. He has full strength proximally in his lower extremities, 4/5 distally.  He has 3 beats clonus in RLE.  His imaging was reviewed.  Compared with CT myelogram from 2022, there is increased cord flattening at C3-4 and C4-5.  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  His MRI L-spine shows epidural lipomatosis, no significant neural element impingement.  He has had steady decline in his cognitive function and functional leg strength over the past two years. 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  He has had some increase in stenosis at C3-4 and C4-5 and has some moderate residual stenosis at C5-6, but I think benefit from a C3-7 posterior cervical decompression and fusion would be questionable.  [facility] did not think further cervical spine surgery would be of much utility, but it would be useful to get an opinion from neurology here.[name]

## 2022-09-06 NOTE — Progress Notes (Addendum)
FMTS Interim Progress Note  Rickey Rivera is a 71 y.o. male presenting with increased altered mental status, recurrent falls, and difficulty urinating.    S: Patient was seen in bed this AM. He notes coming to the hospital due to having difficulty urinating. He notes having 2-3 falls at home due to weak knees.   Per collateral (wife), last week, was falling a lot from having weak knees. When he walks, he bends over. Urinary incontinence, denies bowel incontinence. When he is confused, he is able to recognize family members. He is not agitated and is aware of his surroundings. Wife notes the patient has been having difficulty managing his own medications for about a year.  O: BP (!) 110/48   Pulse 99   Temp 98 F (36.7 C) (Oral)   Resp 16   Wt 260 lb (117.9 kg)   SpO2 95%   BMI 39.53 kg/m    General: Pleasant, well-appearing male in bed. No acute distress. CV: Tachycardia. No murmurs, rubs, or gallops.  Pulmonary: Lungs CTAB. Normal effort. No wheezing or rales. Abdominal: Soft, nontender, nondistended. Normal bowel sounds. Skin: Warm and dry. No obvious rash or lesions. Neuro: A&Ox3. Moves all extremities. Normal sensation. 4/5 strength in RLE and LLE. No focal deficit.  Imaging  MRI Brain: 1. No acute or reversible finding, stable since June 2023. 2. Generalized atrophy and mild periventricular white matter disease.  MRI Cervical: 1. No acute or interval finding. 2. C5-6 ACDF with solid arthrodesis.  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 3. Degenerative cord flattening at C3-4 and C4-5. 4. Uncovertebral spurs narrow the foramina at multiple levels, advanced at C4-5 and moderate at C3-4.  MRI Lumbar: 1. No acute finding or change from June 2023. 2. Generalized lumbar spine degeneration especially affecting the facets. Short pedicles and epidural lipomatosis contribute to moderate thecal sac stenosis at L3-4 and below.  A/P: Acute encephalopathy Has a history of multiple falls.  Neuro exam shows no focal deficits, difficult historian. He had bilateral lower extremity weakness on exam. Unclear if he has urinary incontinence or difficulty voiding.  Labs overall unremarkable with no signs of urinary infection and no electrolyte abnormalities. Based on MRI showing lumbar stenosis, will consult with neurosurgery due to lumbar spine concerns and progressing bilateral lower extremity weakness.  Wife feels that his mentation is back to baseline now. -Neurosurgery consult, appreciate recommendations -PT/OT evaluation -Fall precautions -Continuous cardiac telemetry - TSH, HIV, RPR, and B12 ordered  Urinary incontinence Patient with hospital admission on 07/10/2022 for AMS due to UTI.  Unclear if having urinary retention or incontinence due to difficult historian but had episode of incontinence in his bed.  UA without infectious signs. -Bladder scans every 8 hours -If bladder scan greater than 300 mL, complete in and out cath -If INO cath x3, Place Foley  PAF (paroxysmal atrial fibrillation) ([place]) Noted on chart review. Patient currently appears to be in a junctional rhythm per the EKG, will obtain a repeat as patient is tachycardic without obvious cause at this time. CTA PE done in ER was negative.  - Continuous cardiac telemetry - Repeat EKG  Chronic Conditions Hypothyroidism-Synthroid Dementia-galantamine and memantine IBS-linzess HTN-metoprolol Neuropathy-trileptal Depression-venlafaxine, holding doxepin due to side effect profile  Alesia Morin, MD 09/06/2022, 12:24 PM PGY-1, [facility] Service pager [phone]

## 2022-09-06 NOTE — ED Notes (Signed)
NSURG finished at [facility]. Neuro to see.

## 2022-09-06 NOTE — Hospital Course (Addendum)
Acute encephalopathy Patient was having increasing confusion and falls in the past 2 weeks. Was transferred to [facility] from other ER due to neurology recommendations to get MRIs. Based on MRI results, consulted neurosurgery due to cervical spine concerns and progressing BLE weakness. Labs overall unremarkable with no signs of urinary infection and no electrolyte abnormalities.  Neurology was consulted and feels that his clinical picture is likely secondary to to his chronic myomalacia causing gait disturbance and increased tone in his lower extremities which can contribute to some urinary symptoms and cognitive decline. Neurology recommends outpatient neurology. SNF placement complete.  Multiple falls Per patient history and chart review has had multiple falls recently. Has increased lower extremity weakness, which affects his gait. Uses walker/wheelchair/scooter at home at baseline. Lives with his wife. MRI brain without acute concerns, possibly progression of lower back disease.  Urinary incontinence Patient with recent hospital admission for AMS due to UTI. Unclear if having urinary retention or incontinence due to difficult historian but had episode of incontinence in his bed. UA on admission and repeat five days later without infectious signs.

## 2022-09-06 NOTE — Progress Notes (Signed)
  Patient with history of prior TBI experiencing progressive memory loss and bilateral lower extremity weakness with slightly worsening imaging since June 2023. Spoke with Dr. Glenford Peers with neurosurgery regarding this who agrees to evaluate patient, appreciate neurosurgery team's involvement and recommendations. They may consider nerve conduction studies or further imaging. Patient apparently seen in their office in the past with Dr. Marcello Moores.   Donney Dice, DO 09/06/2022, 9:17 AM PGY-3, [facility] Service pager [phone]

## 2022-09-06 NOTE — Assessment & Plan Note (Addendum)
Rate controlled. - Continue metoprolol tartrate 50mg  BID  - VTE prophylaxis - lovenox
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
## 2022-09-06 NOTE — Assessment & Plan Note (Deleted)
Uses walker/wheelchair/scooter at home at baseline. Neurosurgery notes  benefit from a C3-7 posterior cervical decompression and fusion would be questionable.   - OOB with assistance - Fall precautions

## 2022-09-06 NOTE — Care Management (Signed)
[id number] [place] social worker  [place] called to make sure we had this information.

## 2022-09-06 NOTE — ED Notes (Signed)
PT at [facility].
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
## 2022-09-06 NOTE — NC FL2 (Signed)
[place] MEDICAID FL2 LEVEL OF CARE SCREENING TOOL     IDENTIFICATION  Patient Name: Rickey Rivera Birthdate: April 26, 1951 Sex: male Admission Date (Current Location): 09/05/2022  [facility] and [place] Number:  Producer, television/film/video and Address:  [facility], [address], [place], [place] [address]      Provider Number: [id number]  Attending Physician Name and Address:  Latrelle Dodrill, MD  Relative Name and Phone Number:       Current Level of Care: Hospital Recommended Level of Care: Skilled Nursing Facility Prior Approval Number:    Date Approved/Denied: 09/06/22 PASRR Number: [id number]  Discharge Plan: SNF    Current Diagnoses: Patient Active Problem List   Diagnosis Date Noted   Urinary incontinence 09/06/2022   Acute encephalopathy 09/06/2022   Multiple falls 09/06/2022   Acute metabolic encephalopathy 07/11/2022   Sepsis with end organ damage 07/10/2022   Fall at home, initial encounter 07/10/2022   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 07/10/2022   OSA (obstructive sleep apnea) 10/21/2016   Migraines 10/21/2016   Prediabetes 10/21/2016   PAF (paroxysmal atrial fibrillation) (HCC) 10/18/2016   Peripheral neuropathy    Hypotension 10/15/2016   Hyperlipidemia 10/15/2016   Morbid obesity 10/15/2016   Acute respiratory failure with hypoxia (HCC) 10/15/2016   CAP (community acquired pneumonia)    Elevated brain natriuretic peptide (BNP) level    Elevated liver enzymes    Anxiety 10/08/2016   Depression 10/08/2016   Hypothyroidism 10/08/2016    Orientation RESPIRATION BLADDER Height & Weight     Self, Situation, Time, Place  Normal Continent Weight: 260 lb (117.9 kg) Height:     BEHAVIORAL SYMPTOMS/MOOD NEUROLOGICAL BOWEL NUTRITION STATUS      Continent Diet  AMBULATORY STATUS COMMUNICATION OF NEEDS Skin   Extensive Assist Verbally                         Personal Care Assistance Level of Assistance  Bathing,  Dressing, Feeding Bathing Assistance: Limited assistance Feeding assistance: Independent Dressing Assistance: Limited assistance     Functional Limitations Info  Sight, Hearing, Speech Sight Info: Adequate Hearing Info: Adequate Speech Info: Adequate    SPECIAL CARE FACTORS FREQUENCY  OT (By licensed OT), PT (By licensed PT)     PT Frequency: 5x weekly OT Frequency: 5x weekly            Contractures Contractures Info: Not present    Additional Factors Info  Code Status, Allergies, Psychotropic Code Status Info: Full Code Allergies Info: Bupropion, Citalopram Psychotropic Info: Effexor 300 mg         Current Medications (09/06/2022):  This is the current hospital active medication list Current Facility-Administered Medications  Medication Dose Route Frequency Provider Last Rate Last Admin   gabapentin (NEURONTIN) capsule 600 mg  600 mg Oral QHS Kizzie Ide B, MD       galantamine (RAZADYNE ER) 24 hr capsule 24 mg  24 mg Oral QHS Kizzie Ide B, MD       [START ON 09/07/2022] levothyroxine (SYNTHROID) tablet 150 mcg  150 mcg Oral Q0600 Lance Muss, MD       [START ON 09/07/2022] linaclotide (LINZESS) capsule 145 mcg  145 mcg Oral QAC breakfast Kizzie Ide B, MD       memantine [facility]) tablet 20 mg  20 mg Oral QHS Kizzie Ide B, MD       metoprolol tartrate (LOPRESSOR) tablet 25 mg  25 mg Oral Q12H Alesia Morin, MD       Oxcarbazepine (TRILEPTAL) tablet 600 mg  600 mg Oral BID Coralyn Pear B, MD       venlafaxine XR (EFFEXOR-XR) 24 hr capsule 300 mg  300 mg Oral Q breakfast Alesia Morin, MD       Current Outpatient Medications  Medication Sig Dispense Refill   aspirin 81 MG EC tablet Take 81 mg by mouth daily.     doxepin (SINEQUAN) 75 MG capsule Take 75 mg by mouth at bedtime.     gabapentin (NEURONTIN) 300 MG capsule Take 600 mg by mouth at bedtime. May also take an additional 300 mg during the day, if needed for anxiety and/or headaches      galantamine (RAZADYNE ER) 24 MG 24 hr capsule Take 24 mg by mouth at bedtime. 04/23/22 up to 2-3 a night     levothyroxine (SYNTHROID) 150 MCG tablet Take 150 mcg by mouth daily before breakfast.     linaclotide (LINZESS) 145 MCG CAPS capsule Take 145 mcg by mouth daily before breakfast. Take at least 30 minutes prior to the first meal of the day     melatonin 3 MG TABS tablet Take 9 mg by mouth at bedtime.     memantine (NAMENDA) 10 MG tablet Take 20 mg by mouth at bedtime. to slow memory loss     metoprolol tartrate (LOPRESSOR) 50 MG tablet Take 25 mg by mouth every 12 (twelve) hours.     naproxen (NAPROSYN) 500 MG tablet Take 500 mg by mouth 2 (two) times daily.     Oxcarbazepine (TRILEPTAL) 300 MG tablet Take 600 mg by mouth 2 (two) times daily.     Venlafaxine HCl 150 MG TB24 Take 300 mg by mouth daily with breakfast.     cefadroxil (DURICEF) 500 MG capsule Take 1 capsule (500 mg total) by mouth 2 (two) times daily. (Patient not taking: Reported on 09/06/2022) 14 capsule 0     Discharge Medications: Please see discharge summary for a list of discharge medications.  Relevant Imaging Results:  Relevant Lab Results:   Additional Information SSN: [id number]  Archie Endo, LCSW
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
## 2022-09-06 NOTE — Assessment & Plan Note (Deleted)
Bladder scans have shown minimal urine retention. - Bladder scans q8h - If bladder scan >344mL, complete in-and-out cath - If I&O cath x3, place foley

## 2022-09-06 NOTE — ED Provider Notes (Signed)
Pt with urinary retention, AMS and frequent falls, transferred from [facility] for MRI.  MRI with stable changes.  On examination patient is mildly confused, he is aware that he is confused.  He does have bilateral lower extremity weakness, 5 out of 5 strength in bilateral upper extremities.  Discussed with on-call neurologist-recommends ongoing encephalopathy evaluation, may consider discussing with patient's neurosurgeon regarding cervical spine changes given progressive lower extremity weakness.  Medicine consulted for admission for encephalopathy, urinary retention.   Quintella Reichert, MD 09/06/22 [phone]

## 2022-09-07 ENCOUNTER — Encounter (HOSPITAL_COMMUNITY): Payer: Self-pay | Admitting: Family Medicine

## 2022-09-07 DIAGNOSIS — R296 Repeated falls: Secondary | ICD-10-CM | POA: Diagnosis not present

## 2022-09-07 DIAGNOSIS — G9589 Other specified diseases of spinal cord: Secondary | ICD-10-CM

## 2022-09-07 DIAGNOSIS — G9340 Encephalopathy, unspecified: Secondary | ICD-10-CM | POA: Diagnosis not present

## 2022-09-07 LAB — CBC
HCT: 39.5 % (ref 39.0–52.0)
Hemoglobin: 13.2 g/dL (ref 13.0–17.0)
MCH: 30.8 pg (ref 26.0–34.0)
MCHC: 33.4 g/dL (ref 30.0–36.0)
MCV: 92.3 fL (ref 80.0–100.0)
Platelets: 216 10*3/uL (ref 150–400)
RBC: 4.28 MIL/uL (ref 4.22–5.81)
RDW: 13.3 % (ref 11.5–15.5)
WBC: 6.5 10*3/uL (ref 4.0–10.5)
nRBC: 0 % (ref 0.0–0.2)

## 2022-09-07 LAB — COMPREHENSIVE METABOLIC PANEL
ALT: 16 U/L (ref 0–44)
AST: 19 U/L (ref 15–41)
Albumin: 3.8 g/dL (ref 3.5–5.0)
Alkaline Phosphatase: 87 U/L (ref 38–126)
Anion gap: 8 (ref 5–15)
BUN: 9 mg/dL (ref 8–23)
CO2: 24 mmol/L (ref 22–32)
Calcium: 9.9 mg/dL (ref 8.9–10.3)
Chloride: 105 mmol/L (ref 98–111)
Creatinine, Ser: 0.85 mg/dL (ref 0.61–1.24)
GFR, Estimated: >60 mL/min (ref >60)
Glucose, Bld: 110 mg/dL — ABNORMAL HIGH (ref 70–99)
Potassium: 3.7 mmol/L (ref 3.5–5.1)
Sodium: 137 mmol/L (ref 135–145)
Total Bilirubin: 0.5 mg/dL (ref 0.3–1.2)
Total Protein: 6.2 g/dL — ABNORMAL LOW (ref 6.5–8.1)

## 2022-09-07 LAB — RPR: RPR Ser Ql: NONREACTIVE

## 2022-09-07 LAB — T4, FREE: Free T4: 0.6 ng/dL — ABNORMAL LOW (ref 0.61–1.12)

## 2022-09-07 MED ORDER — LEVOTHYROXINE SODIUM 75 MCG PO TABS
175.0000 ug | ORAL_TABLET | Freq: Every day | ORAL | Status: DC
  Administered 2022-09-08 – 2022-09-12 (×5): 175 ug via ORAL
  Filled 2022-09-07 (×5): qty 1

## 2022-09-07 NOTE — Assessment & Plan Note (Signed)
-  restarted effexor 300 mg -restarted neurontin 600mg

## 2022-09-07 NOTE — TOC Initial Note (Signed)
Transition of Care [facility]) - Initial/Assessment Note    Patient Details  Name: Rickey Rivera MRN: [id number] Date of Birth: 1951/09/04  [facility] CM/SW Contact:    Joanne Chars, LCSW Phone Number: 09/07/2022, 3:37 PM  Clinical Narrative:   Pt oriented x2 this AM, was able to participate in conversation.  Discussed DC recommendation for SNF.  Pt responding somewhat slowly, asked appropriate questions about cost and length of time.  Discussed difference between using [facility] to pay for SNF and using [employer] medicare. Choice document given for [employer]/[place] options.  Pt does not give permission to send out referral, wants to think about it.  Permission given to speak with wife Butch Penny.  Pt lives at home with wife and one son.  Unclear--possibly has [place] in place.  CSW LM with wife Butch Penny to further discuss.                  Expected Discharge Plan: Skilled Nursing Facility Barriers to Discharge: Continued Medical Work up, SNF Pending bed offer   Patient Goals and CMS Choice Patient states their goals for this hospitalization and ongoing recovery are:: walking CMS Medicare.gov Compare Post Acute Care list provided to:: Patient Choice offered to / list presented to : Patient  Expected Discharge Plan and Services Expected Discharge Plan: [place] In-house Referral: Clinical Social Work   Post Acute Care Choice:  (TBD) Living arrangements for the past 2 months: Single Family Home                                      Prior Living Arrangements/Services Living arrangements for the past 2 months: Single Family Home Lives with:: Adult Children, Spouse (with wife and son) Patient language and need for interpreter reviewed:: Yes Do you feel safe going back to the place where you live?: Yes      Need for Family Participation in Patient Care: Yes (Comment) Care giver support system in place?: Yes (comment) Current home services: Other (comment), Home PT  (possible home health--pt unsure of name) Criminal Activity/Legal Involvement Pertinent to Current Situation/Hospitalization: No - Comment as needed  Activities of Daily Living Home Assistive Devices/Equipment: Eyeglasses, CPAP, Electric scooter, Grab bars around toilet, Grab bars in shower, Raised toilet seat with rails, Wheelchair, Environmental consultant (specify type) ADL Screening (condition at time of admission) Patient's cognitive ability adequate to safely complete daily activities?: Yes Is the patient deaf or have difficulty hearing?: No Does the patient have difficulty seeing, even when wearing glasses/contacts?: No Does the patient have difficulty concentrating, remembering, or making decisions?: Yes Patient able to express need for assistance with ADLs?: Yes Does the patient have difficulty dressing or bathing?: Yes Independently performs ADLs?: Yes (appropriate for developmental age) Does the patient have difficulty walking or climbing stairs?: Yes Weakness of Legs: Both Weakness of Arms/Hands: None  Permission Sought/Granted Permission sought to share information with : Family Supports Permission granted to share information with : Yes, Verbal Permission Granted  Share Information with NAME: wife Butch Penny           Emotional Assessment Appearance:: Appears stated age Attitude/Demeanor/Rapport: Engaged Affect (typically observed): Pleasant Orientation: : Oriented to Self, Oriented to Place      Admission diagnosis:  Urinary retention [R33.9] Confusion [R41.0] Falls frequently [R29.6] Hypertension associated with diabetes ([place]) [E11.59, I15.2] Polyneuropathy associated with underlying disease ([place]) [G63] Hypothyroidism, unspecified type [E03.9] Other irritable bowel  syndrome [K58.8] Current moderate episode of major depressive disorder without prior episode (HCC) [F32.1] Alzheimer's dementia without behavioral disturbance, psychotic disturbance, mood disturbance, or anxiety,  unspecified dementia severity, unspecified timing of dementia onset (HCC) [G30.9, F02.80] Patient Active Problem List   Diagnosis Date Noted   Urinary incontinence 09/06/2022   Acute encephalopathy 09/06/2022   Multiple falls 09/06/2022   Acute metabolic encephalopathy 07/11/2022   Sepsis with end organ damage 07/10/2022   Fall at home, initial encounter 07/10/2022   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 07/10/2022   OSA (obstructive sleep apnea) 10/21/2016   Migraines 10/21/2016   Prediabetes 10/21/2016   PAF (paroxysmal atrial fibrillation) (HCC) 10/18/2016   Peripheral neuropathy    Hypotension 10/15/2016   Hyperlipidemia 10/15/2016   Morbid obesity 10/15/2016   Acute respiratory failure with hypoxia (HCC) 10/15/2016   CAP (community acquired pneumonia)    Elevated brain natriuretic peptide (BNP) level    Elevated liver enzymes    Anxiety 10/08/2016   Depression 10/08/2016   Hypothyroidism 10/08/2016   PCP:  [facility] Pharmacy:   [facility] - [place], [place] - [address] [place] [address] Phone: [phone] Fax: [phone]  [facility] [address] [place] [address] Phone: [phone] Fax: [phone]     Social Determinants of Health (SDOH) Interventions    Readmission Risk Interventions     No data to display

## 2022-09-07 NOTE — Assessment & Plan Note (Addendum)
Levothyroxine increased to 175 mcg daily this admission due to elevated TSH

## 2022-09-07 NOTE — Progress Notes (Addendum)
Daily Progress Note Intern Pager: [phone]  Patient name: Rickey Rivera Medical record number: [id number] Date of birth: 11/23/1950 Age: 71 y.o. Gender: male  Primary Care Provider: [facility] Consultants: None Code Status: FULL  Pt Overview and Major Events to Date:  11/2-admitted  Assessment and Plan:  Rickey Rivera is a 71 y.o. male presenting with increased altered mental status, recurrent falls, and difficulty urinating .   * Acute encephalopathy Neuro exam without focality but with marked BLE weakness and urinary incontinence. Has been having increasing confusion and falls. Per neurosurgery, residual myelopathy is expected but does not account for the progressive memory loss and weakness.  - Reconsult neurology - PT recs SNF/OT recs home health - Fall precautions - Out of bed with assistance - Continuous cardiac telemetry   Multiple falls Uses walker/wheelchair/scooter at home at baseline. Neurosurgery notes  benefit from a C3-7 posterior cervical decompression and fusion would be questionable.   - OOB with assistance - Fall precautions  Urinary incontinence Bladder scans have shown minimal urine retention. 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  - Restarted memantine 20 mg  - Restarted galantamine 24 mg  PAF (paroxysmal atrial fibrillation) (HCC) Noted on chart review. - Restarted metoprolol tartrate 50mg  BID   Hypothyroidism History of hypothyroidism. TSH 14.9, free T4 0.6.  -Increase levothyroxine 150 mcg to 175 mcg  Anxiety -restarted effexor 300 mg -restarted neurontin 600mg        FEN/GI: Heart healthy VTE Prophylaxis: SCDs until neurosurgery recommendations    Subjective:  Patient was more sedated this morning, falling asleep during conversation. Was slow on responding to  questions. Said he did not sleep well but did not respond when asked why he had difficulty sleeping.  Objective: Temp:  [97.7 F (36.5 C)-98 F (36.7 C)] 98 F (36.7 C) (11/03 0551) Pulse Rate:  [99-106] 105 (11/02 2101) Resp:  [20] 20 (11/02 1338) BP: (112-126)/(72-87) 112/72 (11/03 0551) SpO2:  [94 %-96 %] 94 % (11/02 1609) Weight:  [280 lb 10.3 oz (127.3 kg)] 280 lb 10.3 oz (127.3 kg) (11/02 1349)  Physical Exam: General: Pleasant, well-appearing, drowsy in bed. No acute distress. CV: RRR. No murmurs, rubs, or gallops. No LE edema Pulmonary: Lungs CTAB. Normal effort. No wheezing or rales. Skin: Warm and dry. No obvious rash or lesions. Neuro: A&Ox2, did not know the year and type of place. More sedated today. With cues, was able to be oriented. Not able to name animals when prompted.  Psych: Normal mood and affect   Laboratory: Most recent CBC Lab Results  Component Value Date   WBC 6.5 09/07/2022   HGB 13.2 09/07/2022   HCT 39.5 09/07/2022   MCV 92.3 09/07/2022   PLT 216 09/07/2022   Most recent BMP    Latest Ref Rng & Units 09/07/2022    3:34 AM  BMP  Glucose 70 - 99 mg/dL 13/01/2022   BUN 8 - 23 mg/dL 9   Creatinine 13/01/2022 - 240 mg/dL 9.73   Sodium 5.32 - 9.92 mmol/L 137   Potassium 3.5 - 5.1 mmol/L 3.7   Chloride 98 - 111 mmol/L 105   CO2 22 - 32 mmol/L 24   Calcium 8.9 - 10.3 mg/dL 9.9    TSH 426 Free T4 0.6 Vit B12 and RPR WNL  , Corda Shutt B,  MD 09/07/2022, 1:10 PM  PGY-1, [place] Intern pager: [phone], text pages welcome Secure chat group [place]

## 2022-09-07 NOTE — Assessment & Plan Note (Deleted)
Cognitive decline since brain hemorrhage in 2011.  - Restarted memantine 20 mg  - Restarted galantamine 24 mg
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
## 2022-09-08 DIAGNOSIS — G9340 Encephalopathy, unspecified: Secondary | ICD-10-CM | POA: Diagnosis not present

## 2022-09-08 LAB — BASIC METABOLIC PANEL
Anion gap: 11 (ref 5–15)
BUN: 10 mg/dL (ref 8–23)
CO2: 25 mmol/L (ref 22–32)
Calcium: 10.5 mg/dL — ABNORMAL HIGH (ref 8.9–10.3)
Chloride: 105 mmol/L (ref 98–111)
Creatinine, Ser: 0.82 mg/dL (ref 0.61–1.24)
GFR, Estimated: >60 mL/min (ref >60)
Glucose, Bld: 105 mg/dL — ABNORMAL HIGH (ref 70–99)
Potassium: 4.1 mmol/L (ref 3.5–5.1)
Sodium: 141 mmol/L (ref 135–145)

## 2022-09-08 LAB — HEMOGLOBIN A1C
Hgb A1c MFr Bld: 5.7 % — ABNORMAL HIGH (ref 4.8–5.6)
Mean Plasma Glucose: 116.89 mg/dL

## 2022-09-08 MED ORDER — ACETAMINOPHEN 500 MG PO TABS
1000.0000 mg | ORAL_TABLET | Freq: Once | ORAL | Status: AC
  Administered 2022-09-08: 1000 mg via ORAL
  Filled 2022-09-08: qty 2

## 2022-09-08 NOTE — Progress Notes (Incomplete)
     Daily Progress Note Intern Pager: [phone]  Patient name: Rickey Rivera Medical record number: [id number] Date of birth: November 22, 1950 Age: 71 y.o. Gender: male  Primary Care Provider: [facility] Consultants: Neurology, neurosurgery Code Status: Full  Pt Overview and Major Events to Date:  11/2-admitted  Assessment and Plan:  71 year old male who initially presented with gradual decline in cognition, gait disturbance, and urinary incontinence likely secondary to chronic myelomalacia and progression of disease from prior TBI, now medically stable for SNF. Pertinent PMH/PSH includes TBI, cerebral hemorrhage, chronic myelomalacia s/p C5-6 ACDF 2022, paroxysmal A-fib not on anticoagulation.  * Acute encephalopathy Back to mental status baseline. - outpatient neurology and neurosurgery follow up  - continue memantine, galantamine - Fall precautions - Out of bed with assistance  PAF (paroxysmal atrial fibrillation) ([place]) Not on anticoagulation.  Rate controlled. - Continue metoprolol tartrate 50mg  BID   Hypothyroidism Levothyroxine increased to 175 mcg daily this admission due to elevated TSH   Other problems chronic and stable: Anxiety-continue gabapentin and venlafaxine    FEN/GI: Heart healthy PPx: SCDs Dispo: SNF when bed available, medically stable for discharge  Subjective:  No overnight events.  Objective: Temp:  [97.6 F (36.4 C)-98.2 F (36.8 C)] 97.6 F (36.4 C) (11/04 1314) Pulse Rate:  [92-98] 92 (11/04 1314) Resp:  [11-18] 15 (11/04 2100) BP: (115-137)/(74-88) 125/84 (11/04 2018) SpO2:  [92 %-93 %] 92 % (11/04 1314) Physical Exam: General: *** Cardiovascular: *** Respiratory: *** Abdomen: *** Extremities: *** Neuro:  Laboratory: Most recent CBC Lab Results  Component Value Date   WBC 6.5 09/07/2022   HGB 13.2 09/07/2022   HCT 39.5 09/07/2022   MCV 92.3 09/07/2022   PLT 216 09/07/2022   Most recent BMP    Latest Ref Rng &  Units 09/08/2022    4:20 AM  BMP  Glucose 70 - 99 mg/dL 105   BUN 8 - 23 mg/dL 10   Creatinine 0.61 - 1.24 mg/dL 0.82   Sodium 135 - 145 mmol/L 141   Potassium 3.5 - 5.1 mmol/L 4.1   Chloride 98 - 111 mmol/L 105   CO2 22 - 32 mmol/L 25   Calcium 8.9 - 10.3 mg/dL 10.5      Imaging/Diagnostic Tests: N/A Zola Button, MD 09/08/2022, 11:37 PM***  PGY-3, [place] Intern pager: [phone], text pages welcome Secure chat group [place]

## 2022-09-08 NOTE — Progress Notes (Signed)
     Daily Progress Note Intern Pager: [phone]  Patient name: Rickey Rivera Medical record number: [id number] Date of birth: Mar 16, 1951 Age: 72 y.o. Gender: male  Primary Care Provider: [facility] Consultants: None Code Status: FULL   Pt Overview and Major Events to Date:  11/2-admitted   Assessment and Plan:   Rickey Rivera is a 71 y.o. male presenting with increased altered mental status, recurrent falls, and difficulty urinating.   * Acute encephalopathy Neuro exam unchanged from prior. Neurology consulted and recommended following up B12, A1c; treating hypothyroidism; and outpatient follow up for neuropsych testing and with neurosurg. B12 normal. PT recs SNF and OT recs home health. - F/u A1c - Ensure outpatient follow up for progressive neuropsychological decline - Fall precautions - Out of bed with assistance - Continuous cardiac telemetry   Multiple falls Uses walker/wheelchair/scooter at home at baseline. Neurosurgery notes  benefit from a C3-7 posterior cervical decompression and fusion would be questionable.   - OOB with assistance - Fall precautions  Urinary incontinence Bladder scans have shown minimal urine retention with most recent scan 2 mL. Suspect potential psychological vs pure neurological cause with cognitive decline. 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  - Continue memantine 20 mg, galantamine 24 mg  PAF (paroxysmal atrial fibrillation) ([place]) Noted on chart review. Rate controlled. - Continue metoprolol tartrate 50mg  BID   Hypothyroidism History of hypothyroidism. TSH 14.9, free T4 0.6.  -Continue levothyroxine 175 mcg  Anxiety -Continue effexor 300 mg, neurontin 600mg    FEN/GI: Heart healthy VTE Prophylaxis: SCDs Dispo: SNF vs HH per patient decision; patient is currently stable for d/c  Subjective:  Doing well this morning without concerns. Wife at  bedside and feels he is stronger here than at baseline.  Objective: Temp:  [97.6 F (36.4 C)-98.2 F (36.8 C)] 98.2 F (36.8 C) (11/04 0521) Pulse Rate:  [98-105] 98 (11/04 0521) Resp:  [16-20] 16 (11/04 0521) BP: (112-140)/(72-88) 137/74 (11/04 0521) SpO2:  [93 %-96 %] 93 % (11/04 0521) Physical Exam: General: Alert and oriented x4, in NAD Skin: Warm, dry HEENT: NCAT, EOM grossly normal, midline nasal septum Cardiac: RRR, no m/r/g appreciated Respiratory: CTAB anteriorly, breathing and speaking comfortably on RA Abdominal: Soft, nontender, nondistended, normoactive bowel sounds Extremities: Moves all extremities grossly equally in bed Neurological: Strength 5/5 in BUE and 3/5 in BLE, sensation intact, CN grossly normal Psychiatric: Appropriate mood and affect  Laboratory: Most recent CBC Lab Results  Component Value Date   WBC 6.5 09/07/2022   HGB 13.2 09/07/2022   HCT 39.5 09/07/2022   MCV 92.3 09/07/2022   PLT 216 09/07/2022   Most recent BMP    Latest Ref Rng & Units 09/08/2022    4:20 AM  BMP  Glucose 70 - 99 mg/dL 105   BUN 8 - 23 mg/dL 10   Creatinine 0.61 - 1.24 mg/dL 0.82   Sodium 135 - 145 mmol/L 141   Potassium 3.5 - 5.1 mmol/L 4.1   Chloride 98 - 111 mmol/L 105   CO2 22 - 32 mmol/L 25   Calcium 8.9 - 10.3 mg/dL 10.5    Free T4 0.60  Ethelene Hal, MD 09/08/2022, 5:41 AM PGY-1, [place] Intern pager: [phone], text pages welcome Secure chat group [facility] Teaching Service

## 2022-09-09 DIAGNOSIS — R29898 Other symptoms and signs involving the musculoskeletal system: Secondary | ICD-10-CM | POA: Diagnosis not present

## 2022-09-09 DIAGNOSIS — G9340 Encephalopathy, unspecified: Secondary | ICD-10-CM | POA: Diagnosis not present

## 2022-09-09 LAB — BASIC METABOLIC PANEL
Anion gap: 7 (ref 5–15)
BUN: 10 mg/dL (ref 8–23)
CO2: 25 mmol/L (ref 22–32)
Calcium: 10.2 mg/dL (ref 8.9–10.3)
Chloride: 105 mmol/L (ref 98–111)
Creatinine, Ser: 0.87 mg/dL (ref 0.61–1.24)
GFR, Estimated: >60 mL/min (ref >60)
Glucose, Bld: 104 mg/dL — ABNORMAL HIGH (ref 70–99)
Potassium: 3.9 mmol/L (ref 3.5–5.1)
Sodium: 137 mmol/L (ref 135–145)

## 2022-09-09 NOTE — Plan of Care (Signed)

## 2022-09-09 NOTE — Care Management Obs Status (Signed)
[place] NOTIFICATION   Patient Details  Name: Rickey Rivera MRN: [id number] Date of Birth: 1951-01-04   Medicare Observation Status Notification Given:  Yes    Tom-Johnson, Renea Ee, RN 09/09/2022, 10:31 AM

## 2022-09-10 DIAGNOSIS — R296 Repeated falls: Secondary | ICD-10-CM | POA: Diagnosis not present

## 2022-09-10 DIAGNOSIS — G9340 Encephalopathy, unspecified: Secondary | ICD-10-CM | POA: Diagnosis not present

## 2022-09-10 LAB — URINALYSIS, ROUTINE W REFLEX MICROSCOPIC
Bilirubin Urine: NEGATIVE
Glucose, UA: NEGATIVE mg/dL
Hgb urine dipstick: NEGATIVE
Ketones, ur: NEGATIVE mg/dL
Leukocytes,Ua: NEGATIVE
Nitrite: NEGATIVE
Protein, ur: NEGATIVE mg/dL
Specific Gravity, Urine: 1.023 (ref 1.005–1.030)
pH: 5 (ref 5.0–8.0)

## 2022-09-10 LAB — BASIC METABOLIC PANEL
Anion gap: 10 (ref 5–15)
BUN: 10 mg/dL (ref 8–23)
CO2: 24 mmol/L (ref 22–32)
Calcium: 10.3 mg/dL (ref 8.9–10.3)
Chloride: 103 mmol/L (ref 98–111)
Creatinine, Ser: 0.94 mg/dL (ref 0.61–1.24)
GFR, Estimated: >60 mL/min (ref >60)
Glucose, Bld: 111 mg/dL — ABNORMAL HIGH (ref 70–99)
Potassium: 3.8 mmol/L (ref 3.5–5.1)
Sodium: 137 mmol/L (ref 135–145)

## 2022-09-10 MED ORDER — ACETAMINOPHEN 325 MG PO TABS
650.0000 mg | ORAL_TABLET | Freq: Four times a day (QID) | ORAL | Status: DC | PRN
  Administered 2022-09-10 (×2): 650 mg via ORAL
  Filled 2022-09-10 (×2): qty 2

## 2022-09-10 MED ORDER — PHENYLEPHRINE HCL-NACL 20-0.9 MG/250ML-% IV SOLN
INTRAVENOUS | Status: AC
  Filled 2022-09-10: qty 500

## 2022-09-10 MED ORDER — PROPOFOL 1000 MG/100ML IV EMUL
INTRAVENOUS | Status: AC
  Filled 2022-09-10: qty 100

## 2022-09-10 NOTE — Progress Notes (Signed)
Pt and wife are concerned that pt has UTI. States that he is urinating frequently and this is what happened in the past.

## 2022-09-10 NOTE — Progress Notes (Signed)
Pt wife is helping him use the stedy to use the restroom.

## 2022-09-10 NOTE — TOC Progression Note (Addendum)
Transition of Care [facility]) - Progression Note    Patient Details  Name: Rickey Rivera MRN: [id number] Date of Birth: 1951-08-28  Transition of Care [facility]) CM/SW Contact  Joanne Chars, LCSW Phone Number: 09/10/2022, 9:43 AM  Clinical Narrative:   CSW spoke with pt and wife Butch Penny.  They are now in agreement for DC plan for SNF, first choice would be [facility].  Permission given to send out referral in hub.  Choice document given.  Referral sent out in hub.  1120: [facility] cannot offer bed.  UPdated pt and wife.  They are requesting responses from [facility] and [facility].  CSW reached out to those SNFs.   1330: [facility] cannot offer.  [facility] still pending.  Spoke with pt and wife.  They would like to hear from [facility], also interested in [facility].  Reached out to both again. [facility] is moving office, will review as able sometime tonight.  [employer] reviewing.     Expected Discharge Plan: [place] Barriers to Discharge: Continued Medical Work up, SNF Pending bed offer  Expected Discharge Plan and Services Expected Discharge Plan: [place] In-house Referral: Clinical Social Work   Post Acute Care Choice:  (TBD) Living arrangements for the past 2 months: Single Family Home                                       Social Determinants of Health (SDOH) Interventions    Readmission Risk Interventions     No data to display
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
## 2022-09-10 NOTE — Discharge Summary (Incomplete)
[facility] Discharge Summary  Patient name: Rickey Rivera Medical record number: [id number] Date of birth: February 10, 1951 Age: 71 y.o. 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 Was transferred to [facility] from other ER due to neurology recommendations to get MRIs. Based on MRI results, consulted neurosurgery due to cervical spine concerns and progressing BLE weakness. Labs overall unremarkable with no signs of urinary infection and no electrolyte abnormalities.  Neurology was consulted and feels that his clinical picture is likely secondary to to his chronic myomalacia causing gait disturbance and increased tone in his lower extremities which can contribute to some urinary symptoms and cognitive decline. Neurology recommends outpatient neurology. SNF placement complete.  Multiple falls Per patient history and chart review has had multiple falls recently. Has increased lower extremity weakness, which affects his gait. Uses walker/wheelchair/scooter at home at baseline. Lives with his wife. MRI brain without acute concerns, possibly progression of lower back disease.  Urinary incontinence Patient with recent hospital admission for AMS due to UTI. Unclear if having urinary retention or  incontinence due to difficult historian but had episode of incontinence in his bed. UA on admission and repeat five days later without infectious signs.   Disposition: SNF  Discharge Condition: stable  Issues for Follow Up:  1. Followup with neurology  Discharge Exam:  Vitals:   09/12/22 0200 09/12/22 0803  BP:  131/74  Pulse:  99  Resp: 10 15  Temp:  97.7 F (36.5 C)  SpO2:  93%   General: Pleasant, well-appearing  in bed. No acute distress. CV: RRR. No murmurs, rubs, or gallops. No LE edema Pulmonary: Lungs CTAB. Normal effort. No wheezing or rales. Abdominal: Soft, nontender, nondistended. Normal bowel sounds. Extremities: Radial and DP pulses 2+ and symmetric. Normal ROM. Skin: Warm and dry. No obvious rash or lesions. Neuro: A&Ox3. Moves all extremities. Normal sensation. No focal deficit. Psych: Normal mood and affect   Significant Labs and Imaging:  No results for input(s): "WBC", "HGB", "HCT", "PLT" in the last 48 hours. Recent Labs  Lab 09/11/22 0433  NA 136  K 3.9  CL 104  CO2 24  GLUCOSE 120*  BUN 11  CREATININE 0.96  CALCIUM 10.2   Imagine MRI brain IMPRESSION: 1. No acute or reversible finding, stable since June 2023. 2. Generalized atrophy and mild periventricular white matter disease.  Results/Tests Pending at Time of Discharge: none  Discharge Medications:  Allergies as of 09/12/2022       Reactions   Bupropion Hives, Shortness Of Breath, Swelling, Rash, Other (See Comments)   Citalopram Hives, Shortness Of Breath, Swelling, Rash, Other (See Comments)        Medication List     STOP taking these medications    cefadroxil 500 MG capsule Commonly known as: DURICEF   naproxen 500 MG tablet Commonly known as: NAPROSYN  TAKE these medications    aspirin EC 81 MG tablet Take 81 mg by mouth daily.   doxepin 75 MG capsule Commonly known as: SINEQUAN Take 75 mg by mouth at bedtime.   gabapentin 300 MG capsule Commonly  known as: NEURONTIN Take 600 mg by mouth at bedtime. May also take an additional 300 mg during the day, if needed for anxiety and/or headaches   galantamine 24 MG 24 hr capsule Commonly known as: RAZADYNE ER Take 24 mg by mouth at bedtime. 04/23/22 up to 2-3 a night   levothyroxine 175 MCG tablet Commonly known as: SYNTHROID Take 1 tablet (175 mcg total) by mouth daily at 6 (six) AM. What changed:  medication strength how much to take when to take this   linaclotide 145 MCG Caps capsule Commonly known as: LINZESS Take 145 mcg by mouth daily before breakfast. Take at least 30 minutes prior to the first meal of the day   melatonin 3 MG Tabs tablet Take 9 mg by mouth at bedtime.   memantine 10 MG tablet Commonly known as: NAMENDA Take 20 mg by mouth at bedtime. to slow memory loss   metoprolol tartrate 50 MG tablet Commonly known as: LOPRESSOR Take 25 mg by mouth every 12 (twelve) hours.   Oxcarbazepine 300 MG tablet Commonly known as: TRILEPTAL Take 600 mg by mouth 2 (two) times daily.   Venlafaxine HCl 150 MG Tb24 Take 300 mg by mouth daily with breakfast.        Discharge Instructions: Please refer to Patient Instructions section of EMR for full details.  Patient was counseled important signs and symptoms that should prompt return to medical care, changes in medications, dietary instructions, activity restrictions, and follow up appointments.   Follow-Up Appointments:  Follow-up Information     Clinic, [facility]. Schedule an appointment as soon as possible for a visit.   Why: Please schedule an appointment for a hospital follow up within the next week. Contact information: [place] [phone] [phone]                 Alesia Morin, MD 09/12/2022, 9:29 AM PGY-1, [place]   I was personally present and re-performed the exam and medical decision. I verified the service and findings are  accurately documented in the intern's note. Please see attending attestation.  Alen Bleacher, MD 09/12/2022 4:59 PM

## 2022-09-10 NOTE — Progress Notes (Signed)
MEWS YELLOW  Pt went to restroom and HR is a little tachy.  Will recheck HR.

## 2022-09-11 DIAGNOSIS — G9340 Encephalopathy, unspecified: Secondary | ICD-10-CM | POA: Diagnosis not present

## 2022-09-11 DIAGNOSIS — G63 Polyneuropathy in diseases classified elsewhere: Secondary | ICD-10-CM | POA: Diagnosis not present

## 2022-09-11 LAB — BASIC METABOLIC PANEL
Anion gap: 8 (ref 5–15)
BUN: 11 mg/dL (ref 8–23)
CO2: 24 mmol/L (ref 22–32)
Calcium: 10.2 mg/dL (ref 8.9–10.3)
Chloride: 104 mmol/L (ref 98–111)
Creatinine, Ser: 0.96 mg/dL (ref 0.61–1.24)
GFR, Estimated: >60 mL/min (ref >60)
Glucose, Bld: 120 mg/dL — ABNORMAL HIGH (ref 70–99)
Potassium: 3.9 mmol/L (ref 3.5–5.1)
Sodium: 136 mmol/L (ref 135–145)

## 2022-09-11 MED ORDER — MELATONIN 3 MG PO TABS: 3.0000 mg | ORAL_TABLET | Freq: Every evening | ORAL | Status: DC | PRN

## 2022-09-11 MED ORDER — TRAZODONE HCL 50 MG PO TABS: 200.0000 mg | ORAL_TABLET | Freq: Every evening | ORAL | Status: DC | PRN

## 2022-09-11 MED ORDER — ENOXAPARIN SODIUM 60 MG/0.6ML IJ SOSY
60.0000 mg | PREFILLED_SYRINGE | INTRAMUSCULAR | Status: DC
  Administered 2022-09-11 – 2022-09-12 (×2): 60 mg via SUBCUTANEOUS
  Filled 2022-09-11 (×2): qty 0.6

## 2022-09-11 MED ORDER — ENOXAPARIN SODIUM 30 MG/0.3ML IJ SOSY: 30.0000 mg | PREFILLED_SYRINGE | INTRAMUSCULAR | Status: DC

## 2022-09-11 NOTE — Progress Notes (Signed)
     Daily Progress Note Intern Pager: [phone]  Patient name: Rickey Rivera Medical record number: [id number] Date of birth: February 04, 1951 Age: 71 y.o. 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   Pertinent PMH/PSH includes TBI, cerebral hemorrhage, chronic myelomalacia s/p C5-6 ACDF 2022, paroxysmal A-fib not on anticoagulation.   * Acute encephalopathy Back to mental status baseline. Repeat UA was negative. - VTE prophylaxis with Lovenox - continue memantine, galantamine - Fall precautions - Out of bed with assistance - Pending SNF, TOC consulted - outpatient neurology and neurosurgery follow up   PAF (paroxysmal atrial fibrillation) ([place]) Rate controlled. - Continue metoprolol tartrate 50mg  BID  - VTE prophylaxis - lovenox  Hypothyroidism Levothyroxine increased to 175 mcg daily this admission due to elevated TSH   Other problems chronic and stable: Anxiety-continue gabapentin and venlafaxine    FEN/GI: Heart healthy PPx: Lovenox Dispo: SNF when bed available, medically stable for discharge  Subjective:  Patient was seen in bed this morning.  He was stable, voicing no concerns.  Denies dysuria.  Objective: Temp:  [97.7 F (36.5 C)-98.5 F (36.9 C)] 97.7 F (36.5 C) (11/07 0821) Pulse Rate:  [98-110] 108 (11/07 0821) Resp:  [16-18] 16 (11/07 0418) BP: (105-130)/(72-87) 130/87 (11/07 0821) SpO2:  [91 %-96 %] 94 % (11/07 1694)  Physical Exam: General: Pleasant, well-appearing elderly male in bed. No acute distress. CV: RRR. No murmurs, rubs, or gallops. No LE edema Pulmonary:  Lungs CTAB. Normal effort. No wheezing or rales. Skin: Warm and dry. No obvious rash or lesions. Neuro: A&Ox3. Moves all extremities. Normal sensation. No focal deficit. Psych: Normal mood and affect    Laboratory: Most recent CBC Lab Results  Component Value Date   WBC 6.5 09/07/2022   HGB 13.2 09/07/2022   HCT 39.5 09/07/2022   MCV 92.3 09/07/2022   PLT 216 09/07/2022   Most recent BMP    Latest Ref Rng & Units 09/11/2022    4:33 AM  BMP  Glucose 70 - 99 mg/dL 120   BUN 8 - 23 mg/dL 11   Creatinine 0.61 - 1.24 mg/dL 0.96   Sodium 135 - 145 mmol/L 136   Potassium 3.5 - 5.1 mmol/L 3.9   Chloride 98 - 111 mmol/L 104   CO2 22 - 32 mmol/L 24   Calcium 8.9 - 10.3 mg/dL 10.2     Alesia Morin, MD 09/11/2022, 11:30 AM  PGY-1, [place] Intern pager: [phone], text pages welcome Secure chat group [place]

## 2022-09-11 NOTE — Progress Notes (Signed)
Pt didn't sleep well.  Will retime 0600 meds.

## 2022-09-11 NOTE — Plan of Care (Signed)

## 2022-09-11 NOTE — TOC Progression Note (Addendum)
Transition of Care [facility]) - Progression Note    Patient Details  Name: Rickey Rivera MRN: [id number] Date of Birth: May 29, 1951  [facility]) CM/SW Contact  Joanne Chars, LCSW Phone Number: 09/11/2022, 10:47 AM  Clinical Narrative:    CSW spoke with pt wife regarding bed offers.  [facility] does offer, [facility] still reviewing.  She would like to accept [facility] offer. [facility] informed and she confirmed can accept.  Auth request submitted in [place].  1345: Auth still pending.  CSW informed by [facility] that they cannot take pt until tomorrow.     Expected Discharge Plan: [place] Barriers to Discharge: Continued Medical Work up, SNF Pending bed offer  Expected Discharge Plan and Services Expected Discharge Plan: [place] In-house Referral: Clinical Social Work   Post Acute Care Choice:  (TBD) Living arrangements for the past 2 months: Single Family Home                                       Social Determinants of Health (SDOH) Interventions    Readmission Risk Interventions     No data to display

## 2022-09-12 DIAGNOSIS — G9340 Encephalopathy, unspecified: Secondary | ICD-10-CM | POA: Diagnosis not present

## 2022-09-12 MED ORDER — LEVOTHYROXINE SODIUM 175 MCG PO TABS: 175.0000 ug | ORAL_TABLET | Freq: Every day | ORAL | 1 refills | Status: AC

## 2022-09-12 NOTE — Progress Notes (Signed)
Report given to Debisa LVN at [facility] reflecting this patient current status.

## 2022-09-12 NOTE — Progress Notes (Signed)
   09/11/22 2010  Assess: MEWS Score  Temp 98.6 F (37 C)  BP (!) 143/83  MAP (mmHg) 97  ECG Heart Rate (!) 122  Resp 18  Level of Consciousness Alert  Assess: MEWS Score  MEWS Temp 0  MEWS Systolic 0  MEWS Pulse 2  MEWS RR 0  MEWS LOC 0  MEWS Score 2  MEWS Score Color Yellow  Assess: if the MEWS score is Yellow or Red  Were vital signs taken at a resting state? Yes  Focused Assessment No change from prior assessment  Does the patient meet 2 or more of the SIRS criteria? No  MEWS guidelines implemented *See Row Information* No, vital signs rechecked  Treat  Pain Scale 0-10  Pain Score 0  Document  Patient Outcome Stabilized after interventions  Progress note created (see row info) Yes  Assess: SIRS CRITERIA  SIRS Temperature  0  SIRS Pulse 1  SIRS Respirations  0  SIRS WBC 1  SIRS Score Sum  2

## 2022-09-12 NOTE — TOC Progression Note (Addendum)
Transition of Care [facility]) - Progression Note    Patient Details  Name: Rickey Rivera MRN: [id number] Date of Birth: June 01, 1951  Transition of Care [facility]) CM/SW Contact  Lorri Frederick, LCSW Phone Number: 09/12/2022, 9:12 AM  Clinical Narrative:   Auth approved in [place]: [id number], [id number], 3 days: 11/8-11/10.  MD informed.   1245: TC [facility] [place].  She is on her way to sign paperwork at [facility].  We discussed her providing transportation and she can do that.  She has some clothes for pt and will come over after she is done at [facility].   Expected Discharge Plan: Skilled Nursing Facility Barriers to Discharge: Continued Medical Work up, SNF Pending bed offer  Expected Discharge Plan and Services Expected Discharge Plan: Skilled Nursing Facility In-house Referral: Clinical Social Work   Post Acute Care Choice:  (TBD) Living arrangements for the past 2 months: Single Family Home                                       Social Determinants of Health (SDOH) Interventions    Readmission Risk Interventions     No data to display

## 2022-09-12 NOTE — TOC Transition Note (Signed)
Transition of Care [facility]) - CM/SW Discharge Note   Patient Details  Name: Rickey Rivera MRN: [id number] Date of Birth: 01-17-1951  Transition of Care [facility]) CM/SW Contact:  Lorri Frederick, LCSW Phone Number: 09/12/2022, 12:53 PM   Clinical Narrative:   Pt discharging to [place], room 223.  RN call report to [phone].   Wife will transport pt and will be here before 2pm.    Final next level of care: Skilled Nursing Facility Barriers to Discharge: Barriers Resolved   Patient Goals and CMS Choice Patient states their goals for this hospitalization and ongoing recovery are:: walking CMS Medicare.gov Compare Post Acute Care list provided to:: Patient Choice offered to / list presented to : Patient  Discharge Placement              Patient chooses bed at:  [facility]) Patient to be transferred to facility by: wife Rickey Rivera Name of family member notified: wife Rickey Rivera Patient and family notified of of transfer: 09/12/22  Discharge Plan and Services In-house Referral: Clinical Social Work   Post Acute Care Choice:  (TBD)                               Social Determinants of Health (SDOH) Interventions     Readmission Risk Interventions     No data to display

## 2022-09-12 NOTE — Progress Notes (Signed)
PT Cancellation Note  Patient Details Name: Rickey Rivera MRN: [id number] DOB: [birth year]   Cancelled Treatment:    Reason Eval/Treat Not Completed: Other (comment).  Declining PT today, will reattempt at another time.   Ivar Drape 09/12/2022, 2:31 PM  Samul Dada, PT PhD Acute Rehab Dept. Number: [facility] [id number] and [facility] [phone]

## 2023-05-19 IMAGING — XA DG MYELOGRAPHY LUMBAR INJ CERVICAL
6 of 14 series · 6 of 14 positions shown · non-contrast
Comparison: MRI cervical spine dated November 30, 2020.

CLINICAL DATA: Chronic neck, bilateral shoulder, and bilateral arm
pain. No improvement after C5-C6 ACDF in [REDACTED].
TECHNIQUE: Contiguous axial images were obtained through the cervical spine
after the intrathecal infusion of contrast. Coronal and sagittal
reconstructions were obtained of the axial image sets.

[Series 1: w cervical spine lat · 0.15mm/px · 1 of 1 slices shown]
[im 1/1]
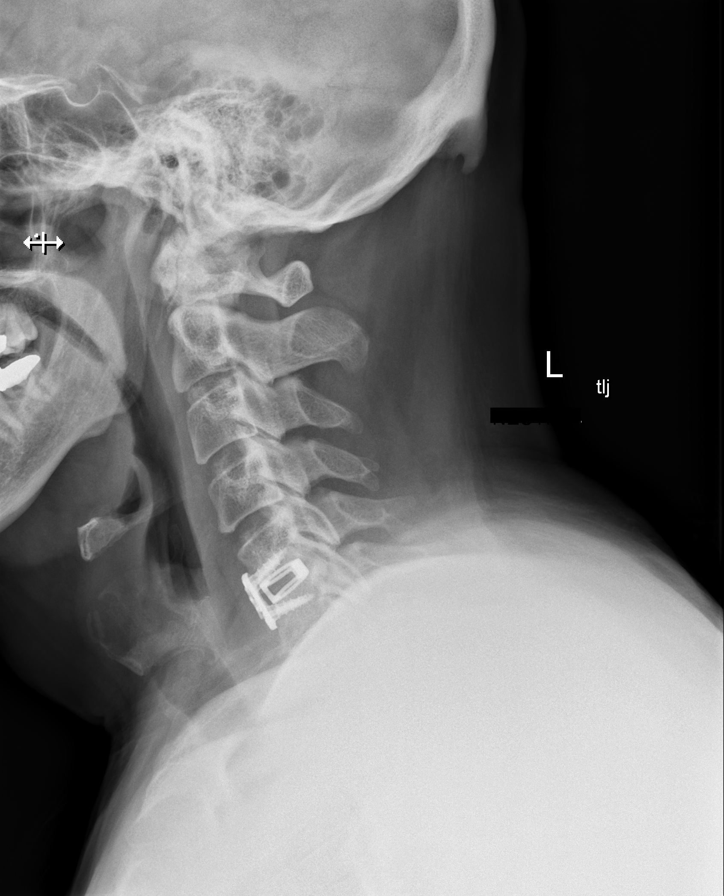

[Series 1: ortho standard · 1 of 1 slices shown (1 of 3)]
[im 1/1]
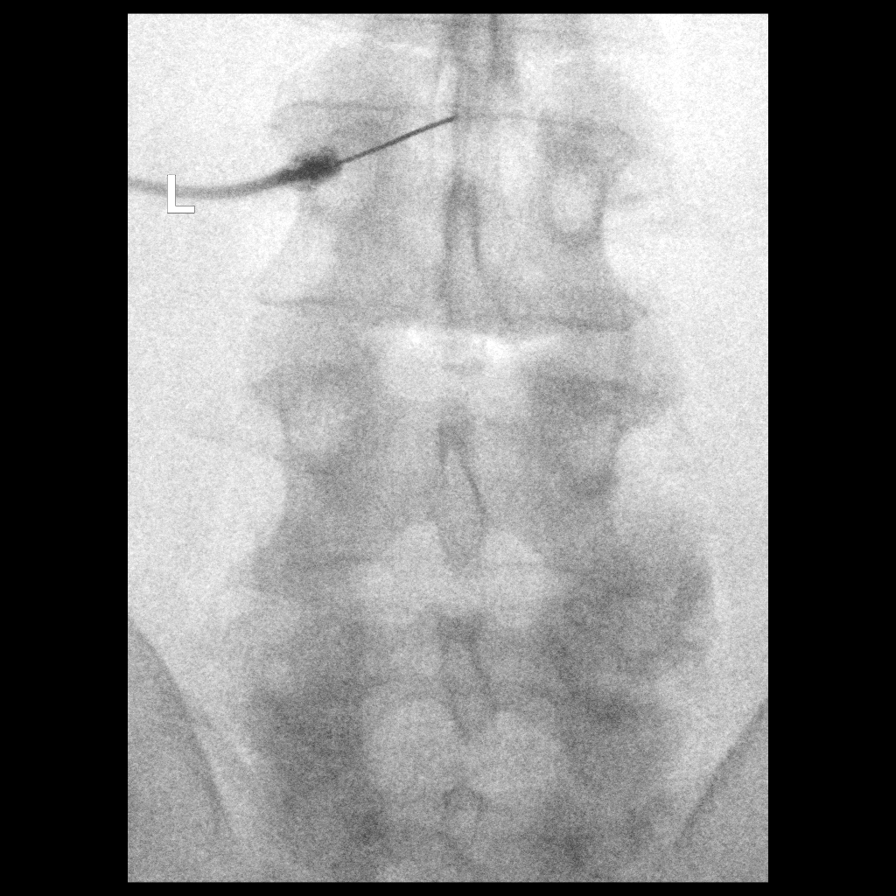

[Series 2: ortho standard · 1 of 1 slices shown (2 of 3)]
[im 1/1]
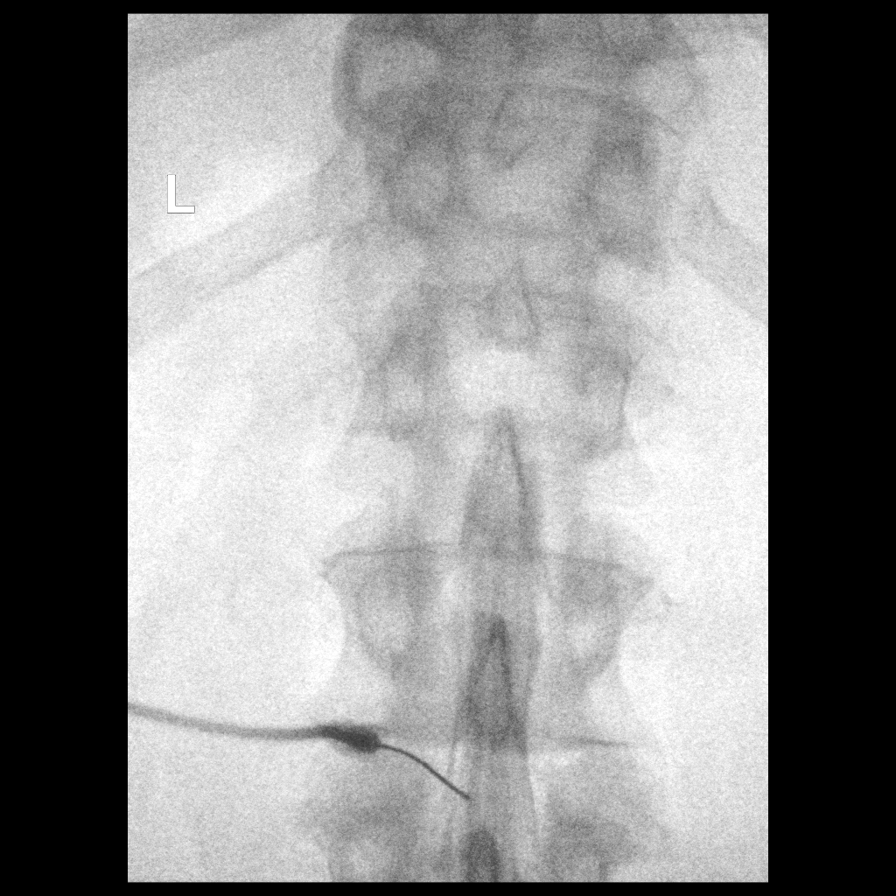

[Series 2: w cervical spine flexion · 0.15mm/px · 1 of 1 slices shown]
[im 1/1]
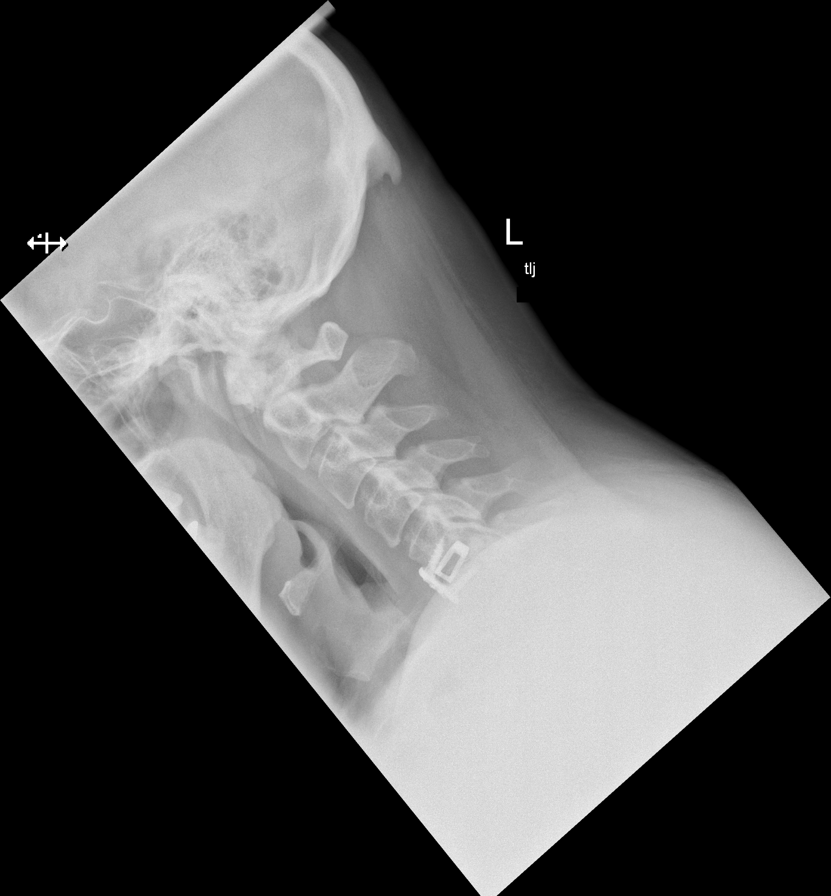

[Series 3: ortho standard · 1 of 1 slices shown (3 of 3)]
[im 1/1]
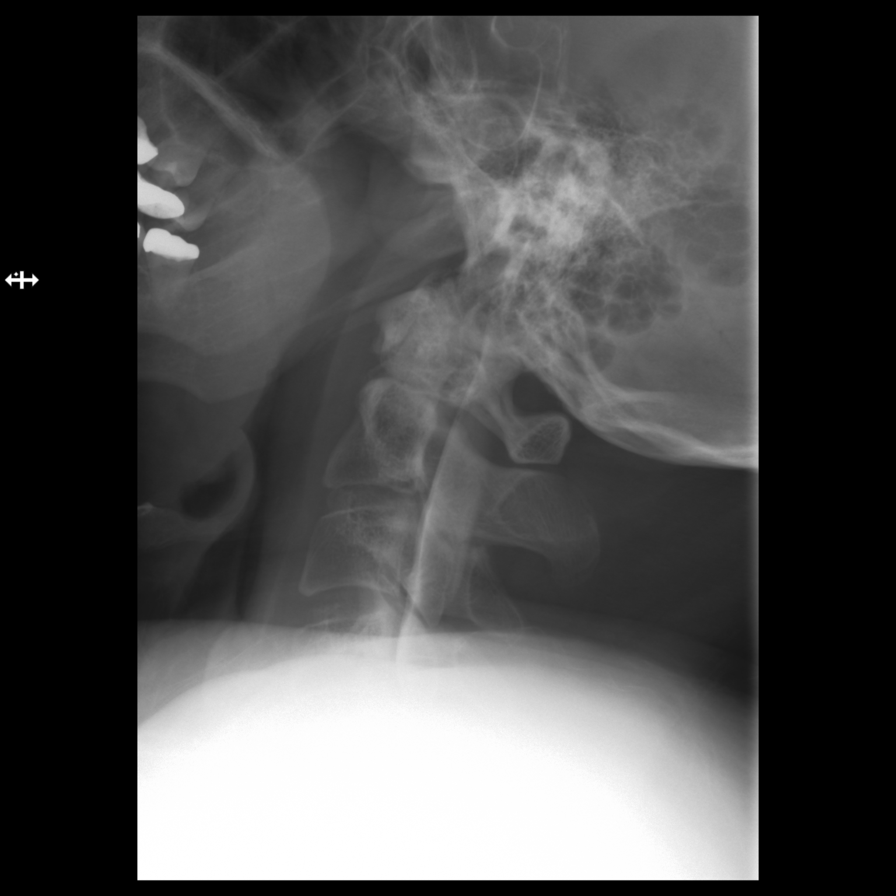

[Series 3: w cervical spine extension · 0.15mm/px · 1 of 1 slices shown]
[im 1/1]
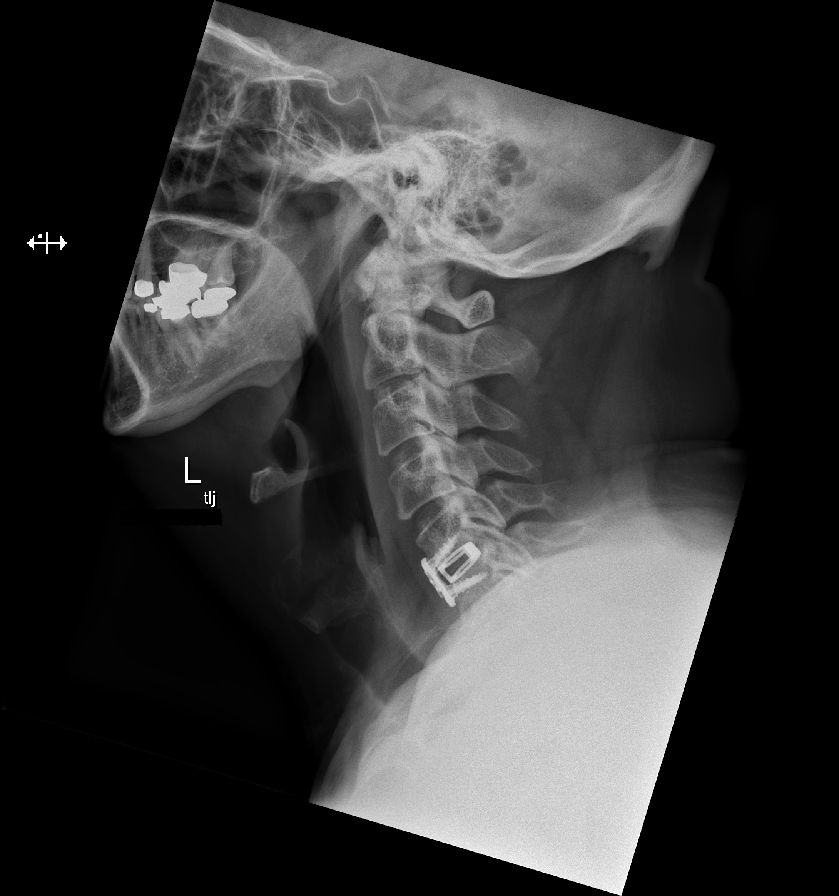

[6 of 14 positions shown; findings below may reference images not displayed]

EXAM:
CERVICAL MYELOGRAM

CT CERVICAL MYELOGRAM

FLUOROSCOPY TIME:  Radiation Exposure Index (as provided by the
fluoroscopic device): 16.1 mGy

Fluoroscopy Time:  1 minutes, 27 seconds

Number of Acquired Images:  0

PROCEDURE:
LUMBAR PUNCTURE FOR CERVICAL MYELOGRAM

After thorough discussion of risks and benefits of the procedure
including bleeding, infection, injury to nerves, blood vessels,
adjacent structures as well as headache and CSF leak, written and
oral informed consent was obtained. Consent was obtained by Dr.
Jamot Sumo. We discussed the high likelihood of obtaining a
diagnostic study.

Patient was positioned prone on the fluoroscopy table. Local
anesthesia was provided with 1% lidocaine without epinephrine after
prepped and draped in the usual sterile fashion. Puncture was
performed at L2-L3 using a 5 inch 22-gauge spinal needle via left
interlaminar approach. Using a single pass through the dura, the
needle was placed within the thecal sac, with return of clear CSF.
10 mL of Isovue Z-VBB was injected into the thecal sac, with normal
opacification of the nerve roots and cauda equina consistent with
free flow within the subarachnoid space. The patient was then moved
to the trendelenburg position and contrast flowed into the cervical
spine region.

I personally performed the lumbar puncture and administered the
intrathecal contrast. I also personally supervised acquisition of
the myelogram images.
FINDINGS: CERVICAL MYELOGRAM FINDINGS:

Prior C5-C6 ACDF. Normal alignment. No dynamic instability. Small
ventral extradural defect at C4-C5. No high-grade spinal canal
stenosis. Underfilling of the bilateral C6 and C7 nerve roots.

CT CERVICAL MYELOGRAM FINDINGS:

Alignment: Normal.

Skull base and vertebrae: Interval C5-C6 ACDF. No evidence of
hardware failure or loosening. No acute fracture or other focal
pathologic process.

Cord: Mild cord volume loss at C5-C6.

Soft tissues and upper chest: Negative.

Disc levels:

C2-C3: No significant disc bulge. Mild right greater than left
uncovertebral hypertrophy. Unchanged mild right neuroforaminal
stenosis. No spinal canal or left neuroforaminal stenosis.

C3-C4: Unchanged small right paracentral disc protrusion flattening
the right ventral cord. Unchanged mild bilateral uncovertebral
hypertrophy. Unchanged mild bilateral neuroforaminal stenosis. No
spinal canal stenosis.

C4-C5: Unchanged mild disc bulging and left uncovertebral
hypertrophy. No stenosis.

C5-C6: Interval ACDF with improved now mild spinal canal stenosis.
Residual moderate to severe bilateral neuroforaminal stenosis due to
uncovertebral hypertrophy.

C6-C7: Unchanged small posterior disc osteophyte complex and
moderate bilateral uncovertebral hypertrophy. Unchanged moderate
bilateral neuroforaminal stenosis. No spinal canal stenosis.

C7-T1: Unchanged small posterior disc osteophyte complex. No
stenosis.

The visualized upper thoracic spine is unremarkable.
IMPRESSION: 1. Interval C5-C6 ACDF with improved now mild spinal canal stenosis.
Residual moderate to severe bilateral neuroforaminal stenosis due to
uncovertebral hypertrophy.
2. Unchanged moderate bilateral neuroforaminal stenosis at C6-C7.
3. Unchanged small right paracentral disc protrusion at C3-C4
flattening the right ventral cord.
4. Mild cord atrophy at C5-C6.

## 2023-10-17 ENCOUNTER — Other Ambulatory Visit: Payer: Self-pay

## 2023-10-17 ENCOUNTER — Emergency Department (HOSPITAL_COMMUNITY)
Admission: EM | Admit: 2023-10-17 | Discharge: 2023-10-18 | Disposition: A | Discharge status: Home / Self Care | Payer: No Typology Code available for payment source | Attending: Emergency Medicine | Admitting: Emergency Medicine

## 2023-10-17 ENCOUNTER — Emergency Department (HOSPITAL_COMMUNITY): Payer: No Typology Code available for payment source

## 2023-10-17 ENCOUNTER — Encounter (HOSPITAL_COMMUNITY): Payer: Self-pay | Admitting: Emergency Medicine

## 2023-10-17 DIAGNOSIS — J40 Bronchitis, not specified as acute or chronic: Secondary | ICD-10-CM | POA: Diagnosis not present

## 2023-10-17 DIAGNOSIS — Z7982 Long term (current) use of aspirin: Secondary | ICD-10-CM | POA: Diagnosis not present

## 2023-10-17 DIAGNOSIS — Z79890 Hormone replacement therapy (postmenopausal): Secondary | ICD-10-CM | POA: Diagnosis not present

## 2023-10-17 DIAGNOSIS — E039 Hypothyroidism, unspecified: Secondary | ICD-10-CM | POA: Insufficient documentation

## 2023-10-17 DIAGNOSIS — R059 Cough, unspecified: Secondary | ICD-10-CM | POA: Diagnosis present

## 2023-10-17 LAB — CBC
HCT: 44.9 % (ref 39.0–52.0)
Hemoglobin: 14.5 g/dL (ref 13.0–17.0)
MCH: 30.1 pg (ref 26.0–34.0)
MCHC: 32.3 g/dL (ref 30.0–36.0)
MCV: 93.3 fL (ref 80.0–100.0)
Platelets: 376 10*3/uL (ref 150–400)
RBC: 4.81 MIL/uL (ref 4.22–5.81)
RDW: 12.4 % (ref 11.5–15.5)
WBC: 7.7 10*3/uL (ref 4.0–10.5)
nRBC: 0 % (ref 0.0–0.2)

## 2023-10-17 LAB — TROPONIN I (HIGH SENSITIVITY): Troponin I (High Sensitivity): 8 ng/L (ref <18)

## 2023-10-17 LAB — BASIC METABOLIC PANEL
Anion gap: 13 (ref 5–15)
BUN: 10 mg/dL (ref 8–23)
CO2: 19 mmol/L — ABNORMAL LOW (ref 22–32)
Calcium: 10.7 mg/dL — ABNORMAL HIGH (ref 8.9–10.3)
Chloride: 102 mmol/L (ref 98–111)
Creatinine, Ser: 0.97 mg/dL (ref 0.61–1.24)
GFR, Estimated: >60 mL/min (ref >60)
Glucose, Bld: 184 mg/dL — ABNORMAL HIGH (ref 70–99)
Potassium: 4 mmol/L (ref 3.5–5.1)
Sodium: 134 mmol/L — ABNORMAL LOW (ref 135–145)

## 2023-10-17 NOTE — ED Triage Notes (Signed)
Pt here for a cough that started 8 weeks ago, wife states she feels it has been longer. Seen at [facility] multiple times for the same per wife. Reports ShOB worse when coughing and with exertion.

## 2023-10-18 ENCOUNTER — Other Ambulatory Visit (HOSPITAL_COMMUNITY): Payer: Medicare PPO

## 2023-10-18 ENCOUNTER — Emergency Department (HOSPITAL_COMMUNITY): Payer: No Typology Code available for payment source

## 2023-10-18 LAB — TROPONIN I (HIGH SENSITIVITY): Troponin I (High Sensitivity): 3 ng/L (ref <18)

## 2023-10-18 MED ORDER — ALBUTEROL SULFATE HFA 108 (90 BASE) MCG/ACT IN AERS
2.0000 | INHALATION_SPRAY | Freq: Once | RESPIRATORY_TRACT | Status: AC
  Administered 2023-10-18: 2 via RESPIRATORY_TRACT
  Filled 2023-10-18: qty 6.7

## 2023-10-18 MED ORDER — ALBUTEROL SULFATE HFA 108 (90 BASE) MCG/ACT IN AERS: 1.0000 | INHALATION_SPRAY | Freq: Four times a day (QID) | RESPIRATORY_TRACT | 0 refills | Status: AC | PRN

## 2023-10-18 MED ORDER — ALUM & MAG HYDROXIDE-SIMETH 200-200-20 MG/5ML PO SUSP
30.0000 mL | Freq: Once | ORAL | Status: AC
Start: 2023-10-18 — End: 2023-10-18
  Administered 2023-10-18: 30 mL via ORAL
  Filled 2023-10-18: qty 30

## 2023-10-18 MED ORDER — AEROCHAMBER PLUS FLO-VU LARGE MISC
1.0000 | Freq: Once | Status: AC
  Administered 2023-10-18: 1

## 2023-10-18 MED ORDER — IOHEXOL 350 MG/ML SOLN
75.0000 mL | Freq: Once | INTRAVENOUS | Status: AC | PRN
  Administered 2023-10-18: 75 mL via INTRAVENOUS

## 2023-10-18 NOTE — Progress Notes (Signed)
RT instructed pt on how to use [employer] (spacer) for at home use. Pt demonstrated back to RT on how to use the [employer] without any error. RT then encouraged that if the pt had anymore questions that we will follow back.
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
## 2023-10-18 NOTE — ED Notes (Signed)
Pt appears confused and states "I'm trying to get this to click in" while referencing pulse ox sticker on finger, VS and neuro check reassessed, pt oriented to to self situation place, but states year is "2035" when asked. Provider aware. Pt reoriented. Pt declines pain, SHOB, and any alcohol use. Door left open and call bell given to pt.

## 2024-07-28 NOTE — Progress Notes (Signed)
 AH BOTT                                          MRN: [id number]   07/28/2024   The [facility] Quality Team Specialist reviewed this patient medical record for the purposes of chart review for care gap closure. The following were reviewed: chart review for care gap closure-glycemic status assessment.    [facility] Quality Team

## 2024-08-31 NOTE — Progress Notes (Signed)
 Rickey Rivera                                          MRN: [id number]   08/31/2024   The [facility] Quality Team Specialist reviewed this patient medical record for the purposes of chart review for care gap closure. The following were reviewed: chart review for care gap closure-kidney health evaluation for diabetes:eGFR  and uACR.    [facility] Quality Team
